# Patient Record
Sex: Female | Born: 1955 | Race: Black or African American | Hispanic: No
Health system: Midwestern US, Community
[De-identification: ages and names within clinical notes are randomized; demographics above are authoritative.]

## PROBLEM LIST (undated history)

## (undated) DIAGNOSIS — N2889 Other specified disorders of kidney and ureter: Secondary | ICD-10-CM

## (undated) DIAGNOSIS — Z01818 Encounter for other preprocedural examination: Secondary | ICD-10-CM

## (undated) DIAGNOSIS — G43909 Migraine, unspecified, not intractable, without status migrainosus: Secondary | ICD-10-CM

## (undated) DIAGNOSIS — I10 Essential (primary) hypertension: Secondary | ICD-10-CM

## (undated) DIAGNOSIS — E7800 Pure hypercholesterolemia, unspecified: Secondary | ICD-10-CM

## (undated) HISTORY — DX: Essential (primary) hypertension: I10

---

## 1993-12-26 HISTORY — PX: UTERINE FIBROID SURGERY: SHX826

## 2004-12-26 NOTE — Procedures (Signed)
[facility]                          STRESS ECHOCARDIOGRAM REPORT   NAME:   [name], [name]                           SS#:   [id number]   DOB:     November 23, 1956                                     AGE:        48   SEX:     F                                           ROOM#:     [id number]   MR#:    [id number]                                       DATE:   12/26/2004   REFERRING PHYSAngelina Ok                              TAPE/INDEX:   299/5837   PRETEST DATA:   INDICATION:  Chest pain   MEDS TAKEN:  --   MEDS HELD:  Metoprolol   TARGET HEART RATE:  173     85%:  147   RISK FACTORS:  Hypertension   BASELINE ECG:  Normal sinus rhythm; within normal limits   EXERCISE SUPERVISED BY:  --   TEST RESULTS:             BRUCE PROTOCOL    STAGE    SPEED (MPH)   GRADE (%)    TIME (MIN:SEC)     HR       BP   Resting                                                  89     120/70      1          1.7           10            3:00         126     184/88      2          2.5           12            3:00         139     200/88      3          3.4           14            0:41         153     ---/--   Recovery  Immediate        --     ---/--                                             2:00         109     165/64                                             4:00          93     140/62                                             6:00          91     130/52   REASON FOR STOPPING:  Fatigue; achieved target heart rate         TOTAL   EXERCISE TIME:  6:41   ACHIEVED HEART RATE:  153 (88%  max HR)                           EST.   METS:  --   HR RESPONSE:  Tachycardic                                         PEAK RPP:   30,600   BP RESPONSE:  Hypertensive                                        CHEST   PAIN:  None   OBSERVED DYSRHYTHMIAS:  None   ST SEGMENT CHANGES:  J point depression with insignificant rapidly   upsloping ST segments                                 WALL MOTION  ANALYSIS   LV WALL SEGMENT             PRE-EXERCISE             POST-EXERCISE   Basal Anteroseptal             Normal                 Hyperkinesis   Basal Septal                   Normal                 Hyperkinesis   Basal Inferoseptal             Normal                 Hyperkinesis   Basal Posterior                Normal                 Hyperkinesis  Basal Lateral                  Normal                 Hyperkinesis   Basal Anterior                 Normal                 Hyperkinesis   Mid Anteroseptal               Normal                 Hyperkinesis   Mid Septal                     Normal                 Hyperkinesis   Mid Inferoseptal               Normal                 Hyperkinesis   Mid Posterior                  Normal                 Hyperkinesis   Mid Lateral                    Normal                 Hyperkinesis   Mid Anterior                   Normal                 Hyperkinesis   Apical Septal                  Normal                 Hyperkinesis   Apical Inferior                Normal                 Hyperkinesis   Apical Lateral                 Normal                 Hyperkinesis   Apical Anterior                Normal                 Hyperkinesis   LV  Chamber Size                                        Smaller   ECG INTERPRETATION:  Negative by ECG criteria.   ECHO INTERPRETATION:  Normal.   OVERALL IMPRESSION:  Negative for ischemia.   ECG AND ECHO INTERPRETATION BY :                                          Harland German., M.D.   Ayesha Rumpf  D: 12/26/2004  T: 12/26/2004  2:56 P    wks

## 2004-12-26 NOTE — Procedures (Signed)
[facility]                          STRESS ECHOCARDIOGRAM REPORT   NAME:   [name], [name]                           SS#:   [id number]   DOB:     03/23/1956                                     AGE:        48   SEX:     F                                           ROOM#:     [id number]   MR#:    [id number]                                       DATE:   12/26/2004   REFERRING PHYS:   R. Manolio                              TAPE/INDEX:   299/5837   PRETEST DATA:   INDICATION:  Chest pain   MEDS TAKEN:  --   MEDS HELD:  Metoprolol   TARGET HEART RATE:  173     85%:  147   RISK FACTORS:  Hypertension   BASELINE ECG:  Normal sinus rhythm; within normal limits   EXERCISE SUPERVISED BY:  --   TEST RESULTS:             BRUCE PROTOCOL    STAGE    SPEED (MPH)   GRADE (%)    TIME (MIN:SEC)     HR       BP   Resting                                                  89     120/70      1          1.7           10            3:00         126     184/88      2          2.5           12            3:00         139     200/88      3          3.4           14            0:41         153     ---/--   Recovery                                 Immediate        --     ---/--                                             2:00         109     165/64                                             4:00          93     140/62                                             6:00          91     130/52   REASON FOR STOPPING:  Fatigue; achieved target heart rate         TOTAL   EXERCISE TIME:  6:41   ACHIEVED HEART RATE:  153 (88%  max HR)                           EST.   METS:  --   HR RESPONSE:  Tachycardic                                         PEAK RPP:   30,600   BP RESPONSE:  Hypertensive                                        CHEST   PAIN:  None   OBSERVED DYSRHYTHMIAS:  None   ST SEGMENT CHANGES:  J point depression with insignificant rapidly   upsloping ST segments                                  WALL MOTION ANALYSIS   LV WALL SEGMENT             PRE-EXERCISE             POST-EXERCISE   Basal Anteroseptal             Normal                 Hyperkinesis   Basal Septal                   Normal                 Hyperkinesis   Basal Inferoseptal             Normal                 Hyperkinesis   Basal Posterior                Normal                 Hyperkinesis     Basal Lateral                  Normal                 Hyperkinesis   Basal Anterior                 Normal                 Hyperkinesis   Mid Anteroseptal               Normal                 Hyperkinesis   Mid Septal                     Normal                 Hyperkinesis   Mid Inferoseptal               Normal                 Hyperkinesis   Mid Posterior                  Normal                 Hyperkinesis   Mid Lateral                    Normal                 Hyperkinesis   Mid Anterior                   Normal                 Hyperkinesis   Apical Septal                  Normal                 Hyperkinesis   Apical Inferior                Normal                 Hyperkinesis   Apical Lateral                 Normal                 Hyperkinesis   Apical Anterior                Normal                 Hyperkinesis   LV  Chamber Size                                        Smaller   ECG INTERPRETATION:  Negative by ECG criteria.   ECHO INTERPRETATION:  Normal.   OVERALL IMPRESSION:  Negative for ischemia.   ECG AND ECHO INTERPRETATION BY :                                          CHARLES C. ASHBY, JR., M.D.   bes  D: 12/26/2004  T: 12/26/2004  2:56 P    wks

## 2004-12-26 NOTE — Procedures (Signed)
[facility]                          STRESS ECHOCARDIOGRAM REPORT   NAME:   [name], [name]                           SS#:   [id number]   DOB:     [birth year]                                     AGE:        48   SEX:     F                                           ROOM#:     [id number]   MR#:    [id number]                                       DATE:   12/26/2004   REFERRING PHYSAngelina Ok                              TAPE/INDEX:   299/5837   PRETEST DATA:   INDICATION:  Chest pain   MEDS TAKEN:  --   MEDS HELD:  Metoprolol   TARGET HEART RATE:  173     85%:  147   RISK FACTORS:  Hypertension   BASELINE ECG:  Normal sinus rhythm; within normal limits   EXERCISE SUPERVISED BY:  --   TEST RESULTS:             BRUCE PROTOCOL    STAGE    SPEED (MPH)   GRADE (%)    TIME (MIN:SEC)     HR       BP   Resting                                                  89     120/70      1          1.7           10            3:00         126     184/88      2          2.5           12            3:00         139     200/88      3          3.4           14            0:41         153     ---/--   Recovery  Immediate        --     ---/--                                             2:00         109     165/64                                             4:00          93     140/62                                             6:00          91     130/52   REASON FOR STOPPING:  Fatigue; achieved target heart rate         TOTAL   EXERCISE TIME:  6:41   ACHIEVED HEART RATE:  153 (88%  max HR)                           EST.   METS:  --   HR RESPONSE:  Tachycardic                                         PEAK RPP:   30,600   BP RESPONSE:  Hypertensive                                        CHEST   PAIN:  None   OBSERVED DYSRHYTHMIAS:  None   ST SEGMENT CHANGES:  J point depression with insignificant rapidly   upsloping ST segments                                  WALL MOTION ANALYSIS   LV WALL SEGMENT             PRE-EXERCISE             POST-EXERCISE   Basal Anteroseptal             Normal                 Hyperkinesis   Basal Septal                   Normal                 Hyperkinesis   Basal Inferoseptal             Normal                 Hyperkinesis   Basal Posterior                Normal                 Hyperkinesis  Basal Lateral                  Normal                 Hyperkinesis   Basal Anterior                 Normal                 Hyperkinesis   Mid Anteroseptal               Normal                 Hyperkinesis   Mid Septal                     Normal                 Hyperkinesis   Mid Inferoseptal               Normal                 Hyperkinesis   Mid Posterior                  Normal                 Hyperkinesis   Mid Lateral                    Normal                 Hyperkinesis   Mid Anterior                   Normal                 Hyperkinesis   Apical Septal                  Normal                 Hyperkinesis   Apical Inferior                Normal                 Hyperkinesis   Apical Lateral                 Normal                 Hyperkinesis   Apical Anterior                Normal                 Hyperkinesis   LV  Chamber Size                                        Smaller   ECG INTERPRETATION:  Negative by ECG criteria.   ECHO INTERPRETATION:  Normal.   OVERALL IMPRESSION:  Negative for ischemia.   ECG AND ECHO INTERPRETATION BY :                                          Harland German., M.D.   Ayesha Rumpf  D: 12/26/2004  T: 12/26/2004  2:56 P    wks

## 2004-12-26 NOTE — Discharge Summary (Signed)
[facility]                       OUTPATIENT CENTER DISCHARGE SUMMARY   NAME: [name], [name]   MR#:          BILLING #: [id number]      DOS: 12/26/2004   DOD:12/26/2004 TIM   [id number]   cc:   Primary Physician:   Date/Time of Admission:  12/26/04 at 0400   Date/Time of Discharge:  12/26/04 at 1215   CHIEF COMPLAINT:   Chest pain.   HISTORY OF PRESENT ILLNESS:  [age]-year-old female seen in the   emergency department earlier this morning with chest pain.  Her initial   emergency department evaluation was unremarkable and she was subsequently   assigned to the observation department under chest pain protocol.   COURSE IN THE OBSERVATION DEPARTMENT:  She continued to have some   discomfort.  Her enzymes were negative.  She underwent exercise stress test   this morning, the results of which were interpreted as no ischemia.   PHYSICAL EXAMINATION:   GENERAL:   Very pleasant female.   VITAL SIGNS:  Stable.   HEENT:  Mouth/Throat:  Surfaces of the pharynx, palate, and tongue are   pink, moist, and without lesions.   NECK:  Supple, nontender, symmetrical, no masses or JVD, trachea midline,   thyroid not enlarged, nodular, or tender.   LYMPHATICS:  No cervical or submandibular lymphadenopathy palpated.   RESPIRATORY:  Clear and equal breath sounds.  No respiratory distress,   tachypnea, or accessory muscle use.   CARDIOVASCULAR:  Heart regular, without murmurs, gallops, rubs, or thrills.   PMI not displaced.   Vascular:   Calves soft and nontender.  No peripheral edema or significant   varicosities. Carotid, femoral, and pedal pulses are satisfactory.  The   abdominal aorta is not palpably enlarged.   CHEST:  Chest symmetrical without masses or tenderness.   ABDOMEN:  Soft and nontender.   The probabilistic nature of cardiac diagnostic testing was explained.  The   patient was counseled to seek further cardiac evaluation should symptoms    worsen or persist without another diagnosis being found.   FINAL DIAGNOSIS:   Acute precordial chest pain, negative stress test.   DISPOSITION:  The patient is discharged with verbal and written   instructions and a referral for ongoing care.  The patient is aware that   they may return at any time for new or worsening symptoms.   The patient   was given a prescription for Vicodin #16.  She is aware she should return   to the emergency department if increasing pain, trouble breathing, or any   concerns.   Electronically Signed By:   Erlinda Hong, M.D. 01/10/2005 06:40   ____________________________   TODD Wilfrid Lund, M.D.   zga  D:  12/26/2004  T:  12/27/2004 10:17 A   [id number]   Blanchard Mane, PA

## 2004-12-26 NOTE — ED Provider Notes (Signed)
[facility]                      EMERGENCY DEPARTMENT TREATMENT REPORT   OBSERVATION UNIT   NAME:  [name], [name] [facility]              PT. LOCATION:     ERO EO15   MR #:         BILLING #: [id number]          DOS: 12/26/2004   TIME: 3:45 A   [id number]   cc:   Primary Physician:   The patient was seen at 2:00 in the morning.   CHIEF COMPLAINT:   Chest pain.   HISTORY OF PRESENT ILLNESS:  This is a 49 year old black female complaining   of chest pain that started earlier tonight after the patient was holding   her grandchild.  She had no shortness of breath, no PND, no orthopnea, and   no edema of the lower extremities.  Does have pain radiating into her left   arm.  Did not get any better or worse.  Now that she took the   nitroglycerin, it has gotten better.  No fever, no chills, no nausea, no   vomiting.   REVIEW OF SYSTEMS:   CONSTITUTIONAL:  No fever, chills, weight loss.   EYES: No visual symptoms.   ENT: No sore throat, runny nose or other URI symptoms.   ENDOCRINE:  No diabetic symptoms.   HEMATOLOGIC/LYMPHATIC:  No excessive bruising or lymph node swelling.   ALLERGIC/IMMUNOLOGIC:  No urticaria or allergy symptoms.   RESPIRATORY:  No cough, shortness of breath, or wheezing.   CARDIOVASCULAR:  Complaining of chest pain.   GASTROINTESTINAL:  No vomiting, diarrhea, or abdominal pain.   GENITOURINARY:  No dysuria, frequency, or urgency.   MUSCULOSKELETAL:  No joint pain or swelling.   INTEGUMENTARY:  No rashes.   NEUROLOGICAL:  No headaches, sensory or motor symptoms.   PAST MEDICAL HISTORY:  Hypertension and obesity.   FAMILY AND SOCIAL HISTORY:  Other than a family history of coronary artery   disease and MI.   ALLERGIES:   No known drug allergies.   MEDICATIONS:   The patient does not recall.   PHYSICAL EXAMINATION:   VITAL SIGNS:  Blood pressure 150/102, pulse 78, respirations 20,   temperature 98.2, O2 saturations of 98%.  Recheck blood pressure 135/61.    CONSTITUTIONAL:  The patient appears well developed and well nourished.   Appearance and behavior are age and situation appropriate.   HEENT:   Head:  Normocephalic, atraumatic.  Eyes:  Conjunctivae clear, lids   normal.  Pupils equal, symmetrical, and normally reactive.   NECK:  Supple, nontender, symmetrical, no masses or JVD, trachea midline,   thyroid not enlarged, nodular, or tender.   RESPIRATORY:  Clear and equal breath sounds.  No respiratory distress,   tachypnea, or accessory muscle use.   CARDIOVASCULAR:  Heart regular rate and rhythm without any rubs, murmurs,   gallops, or thrills.   GI:  Abdomen soft, nontender, without complaint of pain to palpation.  No   hepatomegaly or splenomegaly.   SKIN:  Warm and dry without rashes.   NEUROLOGIC:  Cranial nerves, deep tendon reflexes, strength, and light   touch sensation are unremarkable.   COURSE IN THE EMERGENCY DEPARTMENT:  CBC, BMP, cardiac profile, chest x-ray   PA and lateral, EKG.  The patient was given 1 inch of paste, aspirin 81 mg  x 4 p.o.  On the monitor, normal sinus rhythm.   DIAGNOSTIC TESTING:  EKG reveals normal sinus rhythm with nonspecific ST-T   wave changes.  X-ray shows no acute disease.  Cardiac profile was within   normal limits.  CPK was 216, troponin was 0, MB was 1.2, relative index was   0.6, serum myoglobin was 32.7.  CBC:  White count was 7.9, hemoglobin 13.7,   hematocrit 38.5, platelets are 419. CMP was 137, potassium 3.8, chloride   99, CO2 of 30, glucose 119, BUN 12, creatinine 0.9, SGOT 33, SGPT 33,   alkaline phosphatase 86, bilirubin total is 0.2, calcium 9.2, total protein   7.5, albumin is 3.9.   COURSE IN THE EMERGENCY DEPARTMENT:  The patient will be admitted to the   observation unit at 3:30 in the morning for chest pain.   DISPOSITION/PLAN:  The patient is discharged with verbal and written   instructions and a referral for ongoing care.  The patient is aware that   they may return at any time for new or worsening    symptoms.  Condition is stable.  Disposition is the observation unit.   Serial enzymes will be done prior to the stress test as well.   Electronically Signed By:   Imogene Burn, M.D. 12/27/2004 18:24   ____________________________   Imogene Burn, M.D.   gm  D:  12/26/2004  T:  12/26/2004  7:16 A   [id number]   Hilaria Ota, PA-C

## 2005-10-18 NOTE — Discharge Summary (Signed)
[facility]                       OUTPATIENT CENTER DISCHARGE SUMMARY   FELICIANA, NARAYAN   MR#:          BILLING #: [id number]      DOS: 10/18/2005   DOD:10/19/2005 TIM   05-38-54   cc:   Primary Physician:   Date and time of admission:  10-19-05 at 0200 hours   Date and time of discharge:  10-19-05 at 1130 hours   ADMISSION DIAGNOSES:      1. Acute precordial chest pain, gastroesophageal reflux disease and      distress.      2. History of hypertension, family history of coronary disease.   DISCHARGE DIAGNOSES:      1. Acute precordial chest pain, cardiac unlikely-probable      gastrointestinal versus stress.      2. History of hypertension and family history of coronary disease.   HISTORY:  The patient is a 49 year old female who presented to the   emergency department yesterday for evaluation of chest discomfort radiating   to her left arm.  Initial emergency department evaluation was negative for   acute coronary artery disease.  The patient consequently sent to the   observation unit for second set of enzymes as the patient has had recent   cardiac catheterization in July down at [facility] which showed   just a minor 20% area in the left anterior descending, has an ejection   fraction of 40% and otherwise unremarkable.  D-dimer was normal in the   emergency department.  The patient did have some relief with morphine and   Benadryl.   COURSE IN THE OBSERVATION UNIT:   The patient had some continued   discomfort, relieved with morphine and Benadryl.  Her vital signs were   stable.  The patient's second troponin was 0.0 and her myoglobin went down   to 23.6 from 24.2.   DISPOSITION/PLAN:  Due to the patient's negative second troponin and   myoglobin and recent fairly negative and unremarkable cardiac   catheterization, Dr. Frances Furbish felt that she is fine for discharge and   follow up with her doctors within the week.  To try Prilosec    over-the-counter and will also write her a prescription for Vicodin.  She   is told to return if any worsening.   The probabilistic nature of cardiac diagnostic testing was explained.  The   patient was counseled to seek further cardiac evaluation should symptoms   worsen or persist without another diagnosis being found.  This patient was   examined and discussed with Dr. Frances Furbish and he agrees.   Electronically Signed By:   Stormy Card, M.D. 10/19/2005 14:14   ____________________________   Stormy Card, M.D.   jj  D:  10/19/2005  T:  10/19/2005  1:42 P   [id number]   Olean Ree, PA-C

## 2005-10-18 NOTE — ED Provider Notes (Signed)
OBSERVATION                           [facility]                      EMERGENCY DEPARTMENT TREATMENT REPORT   NAME:  [name]                    PT. LOCATION:     ERO EO05   MR #:         BILLING #: [id number]          DOS: 10/18/2005   TIME:12:17 A   05-38-54   cc:   Primary Physician:   CHIEF COMPLAINT:   Chest pain.   HISTORY OF PRESENT ILLNESS:  The patient is a 49 year old female who has a   24-hour history of substernal chest pain radiating to her left arm that   started while she was asleep.  It comes and goes, gets a little worse when   she is under stress.  She is undergoing a divorce at this time.  She cannot   get comfortable with it.   She went to [facility] and was sent here for   further evaluation.  Given nitroglycerin which she thinks may have helped a   little bit.  EMS transported her across the street.  Saturations were 98%.   Blood pressure 136/106 by EMS.  EMS did not give her any additional   nitroglycerin.  Aspirin already given.   PAST MEDICAL HISTORY:   Hypertension.  The patient was here and had   negative stress test January 1.  Also was admitted in July to [facility] and there underwent catheterization which she said "They saw just   a little thing in one of the arteries."   SOCIAL HISTORY:  Does not smoke.   FAMILY HISTORY:  She had significant history of coronary disease in the   family.   REVIEW OF SYSTEMS:  She has nausea.  No vomiting or diarrhea.  No upper   respiratory symptoms.  No trauma.  No swelling.  No DVT risks.  Denies   complaints in any other system.   ALLERGIES:  None.   MEDICATIONS:   As given above.  At home, she is on metoprolol.   PHYSICAL EXAMINATION:   GENERAL:   Well-developed, well-nourished female who is pleasant, alert,   rubbing her chest.  She is not toxic, not dehydrated.  Slightly anxious,   but otherwise very cooperative.   VITAL SIGNS:  Blood pressure 155/73, pulse 63, respirations 18, temperature    95.  O2 saturation is 99%.   NECK:  Supple.  No JVD or masses.   LUNGS:  Clear to auscultation.   CHEST WALL:  Nontender.  No zoster.   HEART:  Regular rate and rhythm.  No murmurs or gallops.   ABDOMEN:  Nontender, soft, and active bowel sounds.   EXTREMITIES:  No cyanosis or edema.   PULSES:  2 plus, equal.   NEURO:  Alert and oriented, moves all extremities spontaneously, no facial   asymmetry and ambulates without difficulty.   PSYCHIATRIC:  Judgment appears appropriate.  Recent and remote memory   appear to be intact.   Oriented to time, place and person.  Mood and affect   appropriate.   IMPRESSION/MANAGEMENT PLAN:  The patient presents here with chest pain   under a  lot of stress right now.  Will obtain catheterization report.  She   does not want anything for pain like morphine.  GI cocktail will be given   and the patient will be evaluated and treated symptomatically.  Nursing   notes were reviewed.  As an acute illness posing a potential threat to life   or bodily function, this is a high-risk presentation necessitating an   immediate diagnostic evaluation.   CONTINUATION BY DR. KISA:   LABORATORY TESTS:   BMP within normal limits.  Cardiac enzymes negative for   ischemia or infarction.  Hematocrit 36 and hemoglobin 12.  D-dimer normal   at 0.45.  Chest x-ray portable was of poor quality, but a chest x-ray PA   and lateral showed no acute abnormalities per Dr. Arvella Merles.  EKG showed normal   sinus rhythm, no axis deviation, no acute abnormality, similar to the one   done at [facility].   COURSE IN THE EMERGENCY DEPARTMENT:  The patient given Mylanta, viscous   Xylocaine, and Pepcid with minimal relief, but morphine and Benadryl made   her symptoms go away.  I obtained her records from the [facility]   admission in July.  She had a cardiac catheterization then, which showed a   minor 20% area in the LAD, has an EF of 40%.  She had been worked up for    chest pain there.  The patient was placed in the observation unit, and we   will repeat her enzymes.  If she does well on her enzymes, she will be able   to be sent home.  She was questioning help with her stress associated with   her divorce.   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   CLINICAL IMPRESSION:      1. Acute precordial chest pain, gastroesophageal reflux disease and      distress.      2. History of hypertension, family history of coronary disease.   Electronically Signed By:   Wetzel Bjornstad Arvella Merles, M.D. 10/20/2005 01:43   ____________________________   Wetzel Bjornstad. Arvella Merles, M.D.   zga/gm   D:  10/19/2005  T:  10/19/2005  6:25 A   [id number]

## 2005-10-18 NOTE — Procedures (Signed)
.*  Normal sinus rhythm   *Nonspecific T change   .*No significant change since prior tracing   *Improvement in ST-T change since prior ECG

## 2005-10-19 NOTE — Procedures (Signed)
.*  Normal sinus rhythm   *Nonspecific T change   .*No significant change since prior tracing   *Improvement in ST-T change since prior ECG

## 2005-12-13 NOTE — Op Note (Signed)
[facility]                                OPERATION REPORT                       SURGEON:  Charlean Merl, M.D.   [facility] [name], [name]   E:   MR  [id number]                         DATE:            12/13/2005   #:   [name]  [id number]                      PT. LOCATION:   #   Charlean Merl, M.D.   cc:    Charlean Merl, M.D.   PREOPERATIVE DIAGNOSIS:   Pelvic pain, pelvic mass.   POSTOPERATIVE DIAGNOSIS:   Adnexal cyst, adhesions, myoma and endometriosis.   PROCEDURE:   Tubal cystectomy, enterolysis, treatment of uterine leiomyomata,   fulguration of endometriosis, aspiration of ovarian cyst, biopsy of pelvic   inflammatory growth at cecum.   SURGEON:   Dr. Joaquim Lai   ASSISTANT:   Surgical scrub tech   ANESTHESIA:   [facility], general endotracheal.   INDICATIONS:  The patient had the presence of pelvic pain and a pelvic mass   with history of fibroids and endometriosis.   FINDINGS:  There were adhesions and adnexal cyst and ovarian cyst   consistent with the preoperative findings.  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  There was endometriosis present on the pelvic sidewall.   There was an inflammatory growth on the pelvis at the cecum.   DESCRIPTION OF PROCEDURE:  The patient was placed in the dorsal lithotomy   position, draped and prepped in the routine sterile manner.   Pneumoperitoneum and trocar placement were performed without complications.   The above findings were noted.  Attention was turned to the adhesions that   were present along with the adnexal cyst.  Lysis was performed, freeing up   the adnexa until the cyst could be visualized.  This appeared to be arising   from the tube, had benign characteristics.  A tubal cystectomy was   performed, sending it for pathology.  At the completion, hemostasis was   complete.  Attention was turned to adhesions on the descending colon and   cecum.  Enterolysis was performed, dissecting within an avascular  plane,   staying away from the bowel wall.  At the completion, hemostasis was   complete and there was no injury to the bowel.  Attention was turned to the   serosal leiomyomata.  Scissor dissection was used to free up the attachment   to the uterus.  Cautery was used to complete hemostasis in the process of   dissection.  Cautery then vaporized the myomas, which were suction   irrigated and removed.  At the completion, hemostasis was complete and   there was no residual myoma.  Attention was turned to the endometriosis.   The area was isolated, fulgurated, treating the entire area and staying   away from surrounding structure.  At the completion, there was no visible   residual endometriosis.   Attention was turned to the ovarian cyst.  This was a simple ovarian cyst   without evidence  of malignant change.  This was opened, aspirated and   drained.  The base of it was cauterized.  Attention was turned to the   inflammatory growth in the pelvis at the cecum.  This appeared to be a   benign neoplastic change.  This was biopsied and removed in its entirety.   At the completion, hemostasis was complete and there was no evidence of   further change.  The procedure was completed.  The gas was allowed to flow   out of the peritoneal cavity.  The subumbilical fascia was closed with   Vicryl suture.  The skin was closed in a subcuticular fashion.  Ten cc of    % Marcaine was used for local infiltrate.  The final sponge and needle   count was correct.  The patient was taken to the recovery room in good   condition.   IV's:                     500 cc Ringer's lactate.   PACKS &amp; DRAINS:           None.   ESTIMATED BLOOD LOSS:    Minimal.   Electronically Signed By:   Charlean Merl, M.D. 12/16/2005 13:16   _________________________________   Charlean Merl, M.D.   md  D:  12/13/2005  T:  12/13/2005  1:26 P   [id number]

## 2005-12-13 NOTE — Op Note (Signed)
[facility]                                OPERATION REPORT                       SURGEON:  Charlean Merl, M.D.   [facility] [name], [place]rina [place]   E:   MR  [id number]                         DATE:            12/13/2005   #:   Lindley Magnus  [id number]                      PT. LOCATION:   #   Charlean Merl, M.D.   cc:    Charlean Merl, M.D.   PREOPERATIVE DIAGNOSIS:   Pelvic pain, pelvic mass.   POSTOPERATIVE DIAGNOSIS:   Adnexal cyst, adhesions, myoma and endometriosis.   PROCEDURE:   Tubal cystectomy, enterolysis, treatment of uterine leiomyomata,   fulguration of endometriosis, aspiration of ovarian cyst, biopsy of pelvic   inflammatory growth at cecum.   SURGEON:   Dr. Joaquim Lai   ASSISTANT:   Surgical scrub tech   ANESTHESIA:   [facility], general endotracheal.   INDICATIONS:  The patient had the presence of pelvic pain and a pelvic mass   with history of fibroids and endometriosis.   FINDINGS:  There were adhesions and adnexal cyst and ovarian cyst   consistent with the preoperative findings.  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  There was endometriosis present on the pelvic sidewall.   There was an inflammatory growth on the pelvis at the cecum.   DESCRIPTION OF PROCEDURE:  The patient was placed in the dorsal lithotomy   position, draped and prepped in the routine sterile manner.   Pneumoperitoneum and trocar placement were performed without complications.   The above findings were noted.  Attention was turned to the adhesions that   were present along with the adnexal cyst.  Lysis was performed, freeing up   the adnexa until the cyst could be visualized.  This appeared to be arising   from the tube, had benign characteristics.  A tubal cystectomy was   performed, sending it for pathology.  At the completion, hemostasis was   complete.  Attention was turned to adhesions on the descending colon and    cecum.  Enterolysis was performed, dissecting within an avascular plane,   staying away from the bowel wall.  At the completion, hemostasis was   complete and there was no injury to the bowel.  Attention was turned to the   serosal leiomyomata.  Scissor dissection was used to free up the attachment   to the uterus.  Cautery was used to complete hemostasis in the process of   dissection.  Cautery then vaporized the myomas, which were suction   irrigated and removed.  At the completion, hemostasis was complete and   there was no residual myoma.  Attention was turned to the endometriosis.   The area was isolated, fulgurated, treating the entire area and staying   away from surrounding structure.  At the completion, there was no visible   residual endometriosis.   Attention was turned to the ovarian cyst.  This was a simple ovarian cyst   without evidence  of malignant change.  This was opened, aspirated and   drained.  The base of it was cauterized.  Attention was turned to the   inflammatory growth in the pelvis at the cecum.  This appeared to be a   benign neoplastic change.  This was biopsied and removed in its entirety.   At the completion, hemostasis was complete and there was no evidence of   further change.  The procedure was completed.  The gas was allowed to flow   out of the peritoneal cavity.  The subumbilical fascia was closed with   Vicryl suture.  The skin was closed in a subcuticular fashion.  Ten cc of    % Marcaine was used for local infiltrate.  The final sponge and needle   count was correct.  The patient was taken to the recovery room in good   condition.   IV's:                     500 cc Ringer's lactate.   PACKS &amp; DRAINS:           None.   ESTIMATED BLOOD LOSS:    Minimal.   Electronically Signed By:   Charlean Merl, M.D. 12/16/2005 13:16   _________________________________   Charlean Merl, M.D.   md  D:  12/13/2005  T:  12/13/2005  1:26 P   [id number]

## 2008-09-24 NOTE — Procedures (Signed)
Test Reason : Other/CP   Blood Pressure : ***/*** mmHG   Vent. Rate : 068 BPM     Atrial Rate : 068 BPM   P-R Int : 160 ms          QRS Dur : 088 ms   QT Int : 390 ms       P-R-T Axes : 044 040 074 degrees   QTc Int : 414 ms   Normal sinus rhythm   Possible Left atrial enlargement   T wave abnormality, consider anterior ischemia   Abnormal ECG   When compared with ECG of 19-Oct-2005 08:49, No significant change was found   Confirmed by Merilynn Finland, M.D., Scott (26) on 09/25/2008 9:31:59 AM   Referred By: Vinnie Langton, MD           Overread By: Marcello Moores, M.D.

## 2008-09-24 NOTE — Procedures (Signed)
Test Reason : Other/CP   Blood Pressure : ***/*** mmHG   Vent. Rate : 068 BPM     Atrial Rate : 068 BPM   P-R Int : 160 ms          QRS Dur : 088 ms   QT Int : 390 ms       P-R-T Axes : 044 040 074 degrees   QTc Int : 414 ms   Normal sinus rhythm   Possible Left atrial enlargement   T wave abnormality, consider anterior ischemia   Abnormal ECG   When compared with ECG of 19-Oct-2005 08:49, No significant change was found   Confirmed by Robertson, M.D., Scott (26) on 09/25/2008 9:31:59 AM   Referred By: Erik Kisa, MD           Overread By: Scott Robertson, M.D.

## 2008-09-24 NOTE — ED Provider Notes (Signed)
[name]                      EMERGENCY DEPARTMENT TREATMENT REPORT   NAME:  [name]        PT. LOCATION:    [id number]       DOB:  [birth year]                                                                     AGE:  52   MR #:       BILLING #:           DOS: 09/24/2008  TIME: 7:06 P   SEX:  F   [birth year]    [id number]   cc:    Ulla Gallo, M.D.   Primary Physician:   Primary Physician:  Ulla Gallo, M.D.   TIME SEEN 17:20.   CHIEF COMPLAINT   Chest pain.   HISTORY OF PRESENT ILLNESS   A 52 year old female who is coming in with chest pain, said she was sent by   her doctor.  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  When she got done with her lecture, she called her   doctor who sent her here.  This bout of pain has been going on now since   about 3:30, so almost 2 hours.  She describes it as a pain just off to the   right of her chest.  She says she has also been noticing weakness in her   right arm.  She tells me that has been coming and going the last few days.   She has had about four to five bouts of chest pain also over the last   couple of days.  She says she gets sweaty with the pain, but no nausea or   shortness of breath.  She also tells me about an episode a few weeks ago   where she said she felt like her whole right side went numb, and it lasted   about a half an hour.   REVIEW OF SYSTEMS   CONSTITUTIONAL:  No fever, chills, weight loss.   EYES: No visual symptoms.   ENT: No sore throat, runny nose or other URI symptoms.   RESPIRATORY:  No cough, shortness of breath, or wheezing.   CARDIOVASCULAR:  Chest pain as above, no palpitations.   GASTROINTESTINAL:  No vomiting, diarrhea, or abdominal pain.   GENITOURINARY:  No dysuria, frequency, or urgency.   MUSCULOSKELETAL:  No joint pain or swelling.   INTEGUMENTARY:  No rashes.   NEUROLOGICAL: As above.   PAST MEDICAL HISTORY   Hypertension.   SURGICAL HISTORY    Noncontributory.   SOCIAL HISTORY   Denies tobacco.   FAMILY HISTORY Positive  for coronary disease and strokes.   MEDICATIONS Metoprolol, lisinopril and occasional aspirin.   ALLERGIES   None.   PHYSICAL EXAMINATION   Temperature 98.2, pulse 79, blood pressure 119/77, respirations 24, oxygen   97 on room air.   GENERAL:  Well-developed female in no acute distress.   HEAD:  Normocephalic.   EYES:  Anicteric.  Conjunctivae clear.  Pupils round and reactive to  light.   LUNGS:  Clear and equal.  No wheezing noted.   HEART:  Regular, no murmur appreciated.   ABDOMEN:  Nondistended, positive bowel sounds, soft, nontender.   MUSCULOSKELETAL:  Extremities warm, calves soft.  No edema.   SKIN:  Warm and dry without rashes.   NEUROLOGICAL:  She is awake, alert, can answer questions appropriately.   Her right upper extremity strength is a 4/5 compared to the 5/5 on the   left.  Right lower extremity strength also 4/5 compared to 5/5 on the left.   No facial asymmetry noted.  Tongue protrudes midline.   INITIAL ASSESSMENT   This is a 52 year old female who is coming in also with a complaint of   chest pain today, but has some other issues of some weakness and numbness   on her right side which makes me concerned about transient ischemic   attacks.  She also tells me then about episodes for the last 2 days where   she feels like she has been somewhat forgetful about things.  For example,   she said she was trying to get back to her office at work and she felt like   she just kind of forgot where she was going, and then she was giving a   speech for work and kind of just felt like she forgot what she needed to   say.   CONTINUATION BY: ERIK H. Arvella Merles, M.D.   INITIAL ASSESSMENT AND MANAGEMENT PLAN   The patient presents here with a 2-day history of having chest pain which   is worrisome.  She has risk factors of family history of MI in father and 2   of  her siblings, also has hypertension.  Symptoms have been coming and    going  for 4 days, that  with diaphoresis.  Chest pain seems random, event   at rest.  Also concerning  is she  feels weak on the right arm and leg.   Said she feels a little weak with walking, cannot stand for a long time, as   her right leg gets weaker, and she cannot hold things that are heavy with   the right arm because she drops them.  She presented here for further   evaluation of both problems.  This is a new problem for this patient.  Old   records reviewed.   LABORATORY TESTS   EKG showed nonspecific ST-T wave changes.  The CT scan was negative per   radiologist.  Chest x-ray negative per radiologist  for acute changes.   Cardiac enzymes negative for ischemia or infarction at this time.  Normal   BMP, INR and CBC noted.   COURSE IN THE EMERGENCY DEPARTMENT:   The patient was medicated with nitro   paste and aspirin.  Chest pain went away.  Neurological status remained   unchanged.  She had a headache for which Tylenol was given secondary to the   nitroglycerin.  She had repeat exams.  Monitor remained sinus rhythm.  No   acute changes were noted.  We discussed this with Dr. Nedra Hai, because the   patient has had a very mild neurological deficit on the right, and this   will be worked up as an inpatient along with her worrisome chest pain.   This will be evaluated for angina.  Placed on telemetry.   CLINICAL IMPRESSION   1. Acute precordial chest pain.   2. Extensive cardiac risk factors, as noted above.  3. Right side arm and leg weakness suggestive of stroke.   Electronically Signed By:   Wetzel Bjornstad Arvella Merles, M.D. 09/26/2008 23:24   ____________________________   Wetzel Bjornstad. Arvella Merles, M.D.   Dictated by:  Nelma Rothman, PA-C   My signature above authenticates this document and my orders, the final   diagnosis(es), discharge prescription(s) and instructions in the [facility] record.   PB  D:  09/24/2008  T:  09/25/2008  3:41 A   [id number]/

## 2008-09-25 NOTE — Procedures (Signed)
Acquisition Time: 2008-09-25  10:58:20   Total Exercise Time: 67 secs   Test Indications: Chest pain   Medications:   Protocol: REGADENOSON   Max HR: 109 BPM  64% of  Pred: 170 BPM   Max BP: 174/087 mmHG   Max Work Load: 1.0 METS   Conclusion: Please Correlate with Nuclear Report:   Negative ECG Response to IV Regadenoson (Lexiscan)   Conclusion: Please Correlate with Nuclear Report:   Confirmed by Ashby, M.D., Charles Jr. (30) on 09/25/2008 1:21:32 PM   Referred By: Erik Kisa, MD           Overread By: Charles J   Ashby, M.D.

## 2008-09-25 NOTE — Procedures (Signed)
Acquisition Time: 2008-09-25  10:58:20   Total Exercise Time: 67 secs   Test Indications: Chest pain   Medications:   Protocol: REGADENOSON   Max HR: 109 BPM  64% of  Pred: 170 BPM   Max BP: 174/087 mmHG   Max Work Load: 1.0 METS   Conclusion: Please Correlate with Nuclear Report:   Negative ECG Response to IV Regadenoson Eugenie Birks)   Conclusion: Please Correlate with Nuclear Report:   Confirmed by Cleon Gustin, M.D., Madelynn Done. (30) on 09/25/2008 1:21:32 PM   Referred By: Vinnie Langton, MD           Overread By: Merlene Pulling, M.D.

## 2008-09-25 NOTE — Procedures (Signed)
[facility]                                 NUCLEAR REPORT   [name], [name]                           SS#:      [id number]   DOB:     Dec 07, 1956                                     AGE:      51   SEX:     F                                           ROOM#:   [id number]   MR#:    [id number]                                       DATE:    09/25/2008   BILLING#  [id number]   REFERRING PHYS:   [facility]                                 NUCLEAR REPORT   INDICATION   Chest pain   PROCEDURE AND FINDINGS   SPECT sestamibi scintigraphy was performed following Lexiscan-induced   hyperemia with 30 mCi of Tc42m-technetium sestamibi injected and this   following resting SPECT thallium scintigraphy with 4 mCi of  201 thallium   injected.  Stress images reveal normal uniform uptake of radiotracer   throughout the myocardium following Lexiscan-induced hyperemia and an   identical pattern on preceding rest SPECT thallium scintigraphy with no   evidence for redistribution.  R-wave gated cine loop resting left   ventricular sestamibi scintigraphy displays normal chamber size, systolic   function and calculated ejection fraction of 67%.   OVERALL IMPRESSION   Normal Lexiscan gated SPECT nuclear stress test using dual-isotope   technique with left ventricular ejection fraction of 67%.  No evidence for   scar or ischemia.   This study reviewed with Dr. Gates Rigg   Do not type on or below this line   Electronically Signed By:   Donnel Saxon, M.D. 09/26/2008 08:23   ______________________________   Ramon Dredge C. MILLER, M.D.   AK  D: 09/25/2008  T: 09/25/2008  5:55 P    [id number]   cc:   [facility]         EDWARD C. MILLER, M.D.

## 2008-09-25 NOTE — H&P (Signed)
[facility]                              HISTORY AND PHYSICAL                               Garen Grams, M.D.   NAMESHALICIA, [name]   MR #:    [id number]                  ADM DATE:          09/24/2008   BILLING  [id number]                 PT. LOCATION       [id number]   #:   SS #     [id number]               DOB:  [birth year]   AGE:  51   PATTERSON LEE, M.D.                SEX:  F   cc:    Ulla Gallo, M.D.          [facility]          PATTERSON LEE, M.D.   CHIEF COMPLAINT   Chest pain and right-sided weakness.   HISTORY OF THE PRESENT ILLNESS   This patient is a 52 year old black female with a history of hypertension.   She was in her usual state of health until about 3-4 days ago when she   began to have episodes of intermittent chest pain midsternally, described   as sharp to sometimes a heaviness sensation.  It lasted a few minutes and   relieved with rest.  No diaphoresis.  No palpitations.  No cough, fever or   chills.  Around the same time also she began to have episodes of subjective   right-sided weakness with decreased grip strength, but she was ambulatory.   She works as a Veterinary surgeon.  Earlier yesterday, she had the sensation of the   chest discomfort while she was speaking in front of a large group of   students.  She was able to complete her speech and contacted her primary   care doctor who advised her to come to the ER where she was advised   admission.  Presently, she  still has some subjective right-sided weakness.   She denies any chest pain, fever or chills.   PAST MEDICAL HISTORY   Significant for hypertension.  She denies any history of coronary artery   disease.  No history of diabetes.   PAST SURGICAL HISTORY   Includes surgery for a fibroid uterus.   ALLERGIES   The patient has no known drug allergies.   MEDICATIONS   The patient's current medications include metoprolol 50 mg b.i.d. and   lisinopril 20 mg once a day.    PERSONAL AND SOCIAL HISTORY   She is a nonsmoker.  No history of recreational drug or alcohol use.   FAMILY HISTORY   The patient's parents both died in their 57s.  The patient's father had   coronary artery disease.  The patient's mother had a stroke.   REVIEW OF SYSTEMS   CONSTITUTIONAL:   The patient denies any fever or chills.  No change in her   weight.  No change in her appetite.   HEAD:  No headache or dizziness.   EYES:  No eye pain.  No blurry vision.  No eye discharge.   NOSE:  No anosmia.  No epistaxis.  No nasal congestion.   EARS:  Denies ear pain or ear discharge.   MOUTH:  No oral ulcers.  No odynophagia or dysphagia.   NECK:  No neck pain or neck stiffness.  There is full range of motion.   CHEST:  No cough.  No shortness of breath.  No hemoptysis.   HEART:  Shows the above-mentioned chest pain.  No orthopnea.  No PND.  No   palpitations.   ABDOMEN:  No nausea, vomiting, diarrhea, melena or hematochezia.   GENITOURINARY:  No urinary frequency or urgency.  No dysuria.   ENDOCRINE:  No heat or cold intolerance.  No polyuria, polydipsia or   polyphagia.   NEUROLOGIC:  No loss of consciousness.  There is the above-mentioned   right-sided weakness.  No seizure episodes.   PHYSICAL EXAMINATION   GENERAL:  This is a well-developed overweight black female not in any acute   respiratory distress.  VITAL SIGNS:  Temperature is 98, blood pressure   156/81, pulse 82, respirations 18, O2 sats 97% on room air.   HEENT:  Normocephalic and atraumatic.  Conjunctivae pink.  Sclerae are   nonicteric.  Pupils equally round and reactive to light and accommodation.   Full extraocular movements.   NECK:  No JVD.  No bruits.  No lymphadenopathy.  No thyromegaly.  Trachea   is midline with no deviation.  CHEST AND LUNGS:  Symmetrical chest   expansion.  Clear breath sounds.  No rales, wheezes or rhonchi.   HEART:  Regular rate and rhythm.  Normal S1 and S2.  No murmurs, lifts,   heaves or rubs.    ABDOMEN:  Soft, nontender.  No guarding or rebound tenderness.  Bowel   sounds are normoactive.   EXTREMITIES:  No cyanosis.  Good pulses bilaterally   NEUROLOGIC:  The patient is awake, alert and oriented x 3.  Cranial nerves   grossly intact.  There is a decreased grip strength in the right extremity.   IMAGING STUDIES   The patient had a CT scan of the head which did not show any acute infarct   or hemorrhage.  Chest x-ray did not show any acute cardiopulmonary disease.   EKG showed low voltage but otherwise normal sinus rhythm, no acute ST wave   changes.  PT, PTT and INR levels are normal.  Cardiac enzymes with troponin   levels are negative x2.  The white count is 6.9, hemoglobin 12.9, platelets   307, sodium 139, potassium 3.4, chloride 98, bicarbonate 29, glucose 156,   BUN 13 and creatinine 1.1.   IMPRESSION AND PLAN   1. Chest pain, rule out ischemia.   2. Right-sided weakness, to consider a cerebrovascular accident or a      transient ischemic attack.   3. Hypertension.   Plan - The patient is admitted to telemetry.  Will get 3 sets of cardiac   enzymes.  She will be started on aspirin.  Will have physical therapy and   occupational therapy evaluate her.  Will schedule her for an MRI of the   brain, carotid Doppler studies and a 2-D echocardiogram.  Will also check a   lipid panel.  Her overall condition is guarded.   Electronically Signed By:   Garen Grams, M.D. 10/03/2008 04:51   ____________________________   Garen Grams,  M.D.   Marton Redwood  D:  09/25/2008  T:  09/25/2008  8:40 A   [id number]

## 2008-09-25 NOTE — Procedures (Signed)
[facility]                                 NUCLEAR REPORT   NAME:   [name], Rachel                           SS#:      [id number]   DOB:     05/22/1956                                     AGE:      51   SEX:     F                                           ROOM#:   [id number]   MR#:    [id number]                                       DATE:    09/25/2008   BILLING#  [id number]   REFERRING PHYS:   [facility]                                 NUCLEAR REPORT   INDICATION   Chest pain   PROCEDURE AND FINDINGS   SPECT sestamibi scintigraphy was performed following Lexiscan-induced   hyperemia with 30 mCi of Tc99m-technetium sestamibi injected and this   following resting SPECT thallium scintigraphy with 4 mCi of  201 thallium   injected.  Stress images reveal normal uniform uptake of radiotracer   throughout the myocardium following Lexiscan-induced hyperemia and an   identical pattern on preceding rest SPECT thallium scintigraphy with no   evidence for redistribution.  R-wave gated cine loop resting left   ventricular sestamibi scintigraphy displays normal chamber size, systolic   function and calculated ejection fraction of 67%.   OVERALL IMPRESSION   Normal Lexiscan gated SPECT nuclear stress test using dual-isotope   technique with left ventricular ejection fraction of 67%.  No evidence for   scar or ischemia.   This study reviewed with Dr. T. Arntson   Do not type on or below this line   Electronically Signed By:   CHARLES ASHBY, JR, M.D. 09/26/2008 08:23   ______________________________   EDWARD C. MILLER, M.D.   AK  D: 09/25/2008  T: 09/25/2008  5:55 P    [id number]   cc:   [facility]         EDWARD C. MILLER, M.D.

## 2008-09-26 NOTE — Procedures (Signed)
[facility]                              CARDIOLOGY DEPARTMENT                              ECHOCARDIOGRAM REPORT   NAME:  [name], [name]                         SS#:      [id number]   DOB:    Oct 10, 1956                                      AGE:     51   SEX:    F                                           ROOM#:  [id number]   MR#:    [id number]                                       DATE:    09/26/2008   BILLING# [id number]   Referring Physician:  Dr. Garen Grams   Reason For Study:  TIA with bubble   *There is a bubble associated with this examination   M-Mode Measurements (Parasternal)                 Measured   Normal   RVDd                                            36 mm   7-23 mm   IVSD                                            11 mm   7-11 mm   LVIDd                                           45 mm   37-54 mm   LVPWd                                           11 mm   7-11 mm   LVIDs                                           25 mm   Aortic Root Diameter                            30 mm  20-37 mm   ACS                                             mm          16-26 mm   Left Atrial Diameter                            47 mm   19-40 mm   2-Dimensional and Doppler Findings   PROCEDURES PERFORMED:  2-dimensional, color flow and Doppler analysis.   INDICATION:  TIA   FINDINGS   1. There is overall normal left ventricular systolic function and chamber       dimensions.  The estimated left ventricular ejection fraction is 60%.   2. Normal right heart size and function.   3. No evidence of intra-atrial septal defect.   4. Color flow and Doppler analysis demonstrates normal aortic, mitral,       tricuspid and pulmonic valve.   5. Bubble study was performed with no evidence of right to left shunting.   6. No pericardial effusion.   Electronically Signed By:   Donnel Saxon, M.D. 09/27/2008 07:57   __________________________________________________   Deniece Portela OLD, M.D.   M-Modes  Only Dictated by Aram Beecham, ECHOTECH   AK  D: 09/26/2008  T: 09/26/2008  7:26 P    [id number]/   cc:    [facility]          WAYNE OLD, M.D.

## 2008-09-26 NOTE — Procedures (Signed)
Test Reason : chest pain   Blood Pressure : ***/*** mmHG   Vent. Rate : 062 BPM     Atrial Rate : 062 BPM   P-R Int : 162 ms          QRS Dur : 094 ms   QT Int : 428 ms       P-R-T Axes : 040 026 049 degrees   QTc Int : 434 ms   Normal sinus rhythm   Nonspecific T wave abnormality   Abnormal ECG   When compared with ECG of 24-Sep-2008 16:41,   T wave inversion no longer evident in Anterior leads   Confirmed by Tory Emerald, M.D., Maruthi [phone]) on 09/27/2008 7:08:22 AM   Referred By: Vinnie Langton, MD           Overread By: Donella Stade, M.D.

## 2008-09-26 NOTE — Procedures (Signed)
Test Reason : chest pain   Blood Pressure : ***/*** mmHG   Vent. Rate : 062 BPM     Atrial Rate : 062 BPM   P-R Int : 162 ms          QRS Dur : 094 ms   QT Int : 428 ms       P-R-T Axes : 040 026 049 degrees   QTc Int : 434 ms   Normal sinus rhythm   Nonspecific T wave abnormality   Abnormal ECG   When compared with ECG of 24-Sep-2008 16:41,   T wave inversion no longer evident in Anterior leads   Confirmed by Gotti, M.D., Maruthi ([id number]) on 09/27/2008 7:08:22 AM   Referred By: Erik Kisa, MD           Overread By: Maruthi Gotti, M.D.

## 2008-09-26 NOTE — Procedures (Signed)
[facility]                              CARDIOLOGY DEPARTMENT                              ECHOCARDIOGRAM REPORT   NAME:  [name], [name]                         SS#:      [id number]   DOB:    Jul 12, 1956                                      AGE:     51   SEX:    F                                           ROOM#:  [id number]   MR#:    [id number]                                       DATE:    09/26/2008   BILLING# [id number]   Referring Physician:  Dr. Garen Grams   Reason For Study:  TIA with bubble   *There is a bubble associated with this examination   M-Mode Measurements (Parasternal)                 Measured   Normal   RVDd                                            36 mm   7-23 mm   IVSD                                            11 mm   7-11 mm   LVIDd                                           45 mm   37-54 mm   LVPWd                                           11 mm   7-11 mm   LVIDs                                           25 mm   Aortic Root Diameter                            30 mm  20-37 mm   ACS                                             mm          16-26 mm   Left Atrial Diameter                            47 mm   19-40 mm   2-Dimensional and Doppler Findings   PROCEDURES PERFORMED:  2-dimensional, color flow and Doppler analysis.   INDICATION:  TIA   FINDINGS   1. There is overall normal left ventricular systolic function and chamber       dimensions.  The estimated left ventricular ejection fraction is 60%.   2. Normal right heart size and function.   3. No evidence of intra-atrial septal defect.   4. Color flow and Doppler analysis demonstrates normal aortic, mitral,       tricuspid and pulmonic valve.   5. Bubble study was performed with no evidence of right to left shunting.   6. No pericardial effusion.   Electronically Signed By:   Donnel Saxon, M.D. 09/27/2008 07:57   __________________________________________________   Deniece Portela OLD, M.D.    M-Modes Only Dictated by Aram Beecham, ECHOTECH   AK  D: 09/26/2008  T: 09/26/2008  7:26 P    [id number]/   cc:    [facility]          WAYNE OLD, M.D.

## 2008-09-27 NOTE — Consults (Signed)
[facility]                               CONSULTATION REPORT                     CONSULTANT:  Regina Eck, [name].D.   NAME:       [name], [name]   BILLING #:  [id number]              DATE OF CONSULT:     09/26/2008   MR #:       [id number]               ADM DATE:            09/24/2008   SS #        [id number]            PT. LOCATION:        [id number]               DOB:  05/08/1956       AGE:  52             SEX:   F   ATTENDING:  [facility]   cc:    Ulla Gallo, [name].D.          Regina Eck, [name].D.          [facility]          Garen Grams, [name].D.          Hubert Azure, [name].D.   SS#: [id number]   CONSULTATION REQUESTED BY:  Dr. Marc Morgans, Hospitalist   REASON FOR REFERRAL   The patient was referred to cardiology for evaluation because of episodic   substernal chest discomfort.   HISTORY OF PRESENT ILLNESS   The patient is a  52 year old, African-American female with history of   systemic hypertension and hyperlipidemia, who was admitted to this hospital   on 09/24/2008 with intermittent episodes of substernal chest discomfort   that she describes as a heaviness and someone sitting on her chest.  This   is not associated with any nausea, vomiting, or diaphoresis.  These   episodes were occurring at random and not related to physical activities.   The patient was admitted to the hospital because of episodic chest   discomfort.   The patient states that associated with her chest discomfort she felt weak   on her right side and apparently she felt some numbness in her extremities.   The patient was admitted the hospital because of her evaluation of her   chest discomfort and right-sided weakness.  The patient underwent CT scan   of her head which demonstrated no evidence of infarction.   The patient had cardiac enzymes drawn and a 12-lead EKG was performed which   demonstrated no evidence of ischemia.  The patient underwent    pharmacological stress myocardial perfusion study on 09-25-08 which   demonstrated no evidence of ischemia.  Her left ventricular ejection   fraction by gated SPECT study was noted to be 67%..  The patient underwent   a 2-D echocardiographic study which demonstrated left ventricular ejection   fraction of about 60%.  The patient did have a bubble study done which   demonstrated no evidence of interatrial shunt.   The patient still has occasional episodes of substernal chest discomfort   despite negative stress myocardial perfusion study.  For  this reason   cardiac consultation was requested.   On questioning, the patient stated that she never had any known coronary   artery disease and exertion does not seem to bother her.   PAST MEDICAL HISTORY:  As noted above, is positive for   1. Systemic hypertension and hyperlipidemia.   2. She denies any diabetes mellitus.   3. The patient denies any prior myocardial infarction or rheumatic heart      disease.   4. She had a history of fibroid of uterus and required surgery.   PERSONAL HISTORY   The patient denies any history of smoking or alcohol abuse.   ALLERGIES   No known drug allergies.   SOCIAL HISTORY   The patient used to live in [place] and she moved away from [place]   because of [place] and she now lives and works in [place].   MEDICATIONS PRIOR TO ADMISSION:  Include   1. Metoprolol 50 mg p.o. b.i.d.   2. Lisinopril 20 mg daily.   FAMILY HISTORY:   Her father apparently had coronary artery disease and her mother suffered   from stroke.   REVIEW OF SYMPTOMS   NEUROLOGICAL:  As above.   CARDIAC:   As above.   PULMONARY:  Denies productive cough or hemoptysis.   GASTROINTESTINAL:   Denies any hematemesis or melena.   GENITOURINARY:  Negative.   MUSCULOSKELETAL:  Denies any claudication.   PHYSICAL EXAMINATION   GENERAL:  The patient is alert, appropriate, in no acute distress.    VITAL SIGNS:  Revealed pulse rate of 55 per minute, blood pressure 116/70   mmHg.  She is afebrile.  Respirations 18 per minute.   HEENT:  Sclerae anicteric.  Pupils are equal and reactive to light.   NECK:  Revealed no JVD.  Carotid upstroke is fair.   No bruits were noted.   CHEST:   Clear  to percussion and auscultation.   CARDIAC:  PMI is in the fifth intercostal space inside left midclavicular   line.  S1 and S2 normal.  No murmurs or gallops were appreciated.   ABDOMEN:  No organomegaly.  Bowel sounds present.   EXTREMITIES:   No edema.  Pedal pulses are intact.   NEUROLOGICAL:  No focal deficit.   LABORATORY DATA   Her BUN and creatinine were noted to be 13 and 0.9, serum potassium 4.2.   CPK and CK-MB were within normal limits.  Electrocardiogram demonstrated   sinus rhythm and nonspecific T-wave abnormalities in the anteroseptal   leads.   Noninvasive carotid studies revealed no flow-limiting carotid disease.   CLINICAL IMPRESSIONS   1. Episodic substernal chest discomfort that is atypical.   2. Negative pharmacological stress myocardial perfusion study for      ischemia.   3. Normal left ventricular systolic function by 2-D echocardiography and      there is no evidence of interatrial shunt.   4. Systemic hypertension.   5. Hyperlipidemia.   6. Exogenous obesity.   7. Apparent right-sided weakness without any evidence of carotid disease      and normal CT scan of her head.   DISPOSITION   Thank you for asking me to see this patient because of her episodic chest   discomfort.  Her chest discomfort is atypical; however, she has multiple   coronary risk factors.  I would recommend evaluating her noncardiac causes   of cp such as gastroesophageal reflux disease and evaluate her for any  evidence of pulmonary thromboembolic disease.  If no etiology is found and   if she has persistent episodes of chest discomfort, I would recommend   coronary angiography for a definitive diagnosis.    Thank you for asking me to see this patient.  Will follow with you during   this hospital stay.   Electronically Signed By:   Regina Eck, [name].D. 09/30/2008 09:30   ____________________________   Regina Eck, [name].D.   Lennon Alstrom  D:  09/27/2008  T:  09/28/2008  1:21 P   [id number]

## 2008-09-28 NOTE — Discharge Summary (Signed)
[facility]                                DISCHARGE SUMMARY                               Hubert Azure, M.D.   NAME:  [name], [name]   MR #:  [id number]                      ADM DATE:   09/24/2008   Tiburcio Bash [id number]                     DIS DATE:     09/27/2008   G#   SS #   [id number]                   DOB:  [birth year]   Hubert Azure, M.D.                   AGE:52                   SEX:  F   cc:    Ulla Gallo, M.D.          Hubert Azure, M.D.   DISCHARGE DIAGNOSES   1. Atypical chest pain possibly gastroesophageal reflux disease.   2. Transient subjective right arm weakness possibly transient ischemic      attack.   3. Hypertension.   4. Dyslipidemia.   DISCHARGE MEDICATIONS   1. Aspirin 81 mg daily.   2. Colace 100 mg b.i.d.   3. Lisinopril 10 mg daily.   4. Metoprolol 50 mg b.i.d.   5. Niacin 500 mg p.o. at bedtime.   6. Omeprazole 20 mg q.a.m.   7. Simvastatin 40 mg q.p.m.   HOSPITAL COURSE   The patient is a 52 year old female who presented to [facility] on the [facility]th with chest pain which she described as heaviness   and mid sternal as well as some transient right-sided weakness, right arm   weakness essentially.  When she came into the hospital in the emergency   room she had a head CT done to rule out a stroke.  Head CT was negative.   She had a 12-lead EKG which showed normal sinus rhythm, possibly left   atrial enlargement and T-wave abnormality, consider anterior ischemia.   However, when compared to an EKG done in 2006 there were no significant   changes.  She was admitted to telemetry for rule out acute coronary   syndrome and possible TIA or stroke.  She had three sets of cardiac   enzymes.  Slight initial bump in the initial CPK at 182, otherwise all   other sets of cardiac enzymes were completely negative.  Chest x-ray on   admission which showed that the heart was top normal size.  The lung fields    and pulmonary vasculature were normal.  She had a 2-D echocardiogram done   which showed normal left ventricular systolic function and chamber   dimensions, estimated left ventricular ejection fraction of 60%, normal   right heart size and function.  No evidence of intra-atrial septal defect.   Bubble study was performed with no evidence of right-to-left shunting.   Color flow and Doppler analysis demonstrates normal aortic, mitral and   tricuspid  pulmonic valves.  She had a slight decrease in her potassium on   admission 3.4 that was repleted.   The patient underwent a carotid Doppler ultrasound during the workup for   possible TIA.  Carotid Doppler ultrasound showed no evidence of significant   extracranial carotid or vertebral artery occlusive disease and a 1% to 49%   disease bilaterally.  The patient underwent a stress test on 09/25/2008   which showed normal gated SPECT nuclear stress test.  No evidence for scar   or ischemia and left ventricular ejection fraction of 67%.  The patient,   however, continued to have this chest pressure intermittently during this   admission.  Cardiology was then consulted to see the patient.  Based on the   fact that the patient's symptoms did not resolve despite a negative stress   test they recommended getting a CT scan of the chest to rule out noncardiac   causes.  Cardiology actually thought that the chest pain was probably   noncardiac in nature and recommended to get other tests to rule out   possibly noncardiac chest pain.  A CT scan of the chest was ordered.  The   patient however declined and did not want to go through with the test.  She   did have some concerns, according to the CT technician, regarding the use   of IV dye and so she was unable to perform that test.  She did have a   fasting lipid profile done in here during this workup.  A fasting lipid   profile showed a triglyceride of 253, total cholesterol of 222, HDL of 34    and LDL of 137.  She was then started on Zocor 40 mg daily as well as   niacin 500 at bedtime.   With regards to possible cerebrovascular accident the patient went for an   MRI of the brain; however, the test was inaccurate and unreliable and   difficult to complete due to the fact that the patient had raised her arm   and there was a lot of interference with the metal braces in her mouth with   the MRI machine.  She was unable to do that test.  She however did not have   any objective signs of weakness during this admission.  Currently the   patient states she is doing very well.  The chest pain had resolved.  She   however complains of some chronic constipation which she said has been   ongoing and chronic.  She is advised on a high-fiber diet.  She was giving   a prescription for Colace 100 mg b.i.d.  She also stated that she had   cardiac catheterization done 3 years ago which was completely normal.  She   was advised that if she continued to have this chest pain or if she had a   repeat, otherwise she would follow up with her primary care doctor, Dr.   Ulla Gallo, in about 1 to 2 weeks.  She has also been advised on proper   diet, low-cholesterol diet and moderate exercise.   Electronically Signed By:   Lorain Childes, M.D. 10/12/2008 19:38   ____________________________   Hubert Azure, M.D.   LE  D:  09/28/2008  T:  09/29/2008  3:31 P   [id number]

## 2008-10-01 NOTE — Procedures (Signed)
Test Reason : HEADACHE AND HEAVINESS ON SIDE OF FACE   Blood Pressure : ***/*** mmHG   Vent. Rate : 058 BPM     Atrial Rate : 058 BPM   P-R Int : 164 ms          QRS Dur : 090 ms   QT Int : 446 ms       P-R-T Axes : 035 033 059 degrees   QTc Int : 437 ms   Sinus bradycardia   Otherwise normal ECG   When compared with ECG of 26-Sep-2008 16:39,   No significant change was found   Confirmed by Gotti, M.D., Maruthi ([id number]) on 10/02/2008 9:48:43 AM   Referred By: Ben Fickenscher, MD           Overread By: Maruthi Gotti, M.D.

## 2008-10-01 NOTE — Procedures (Signed)
Test Reason : HEADACHE AND HEAVINESS ON SIDE OF FACE   Blood Pressure : ***/*** mmHG   Vent. Rate : 058 BPM     Atrial Rate : 058 BPM   P-R Int : 164 ms          QRS Dur : 090 ms   QT Int : 446 ms       P-R-T Axes : 035 033 059 degrees   QTc Int : 437 ms   Sinus bradycardia   Otherwise normal ECG   When compared with ECG of 26-Sep-2008 16:39,   No significant change was found   Confirmed by Tory Emerald, M.D., Maruthi ([id number]) on 10/02/2008 9:48:43 AM   Referred By: Dixie Dials, MD           Overread By: Donella Stade, M.D.

## 2008-10-01 NOTE — ED Provider Notes (Signed)
[facility]                      EMERGENCY DEPARTMENT TREATMENT REPORT   NAME:  [name], [name]        PT. LOCATION:    ER  [id number]       DOB:  [birth year]                                                                     AGE:  51   MR #:       BILLING #:           DOS: 10/01/2008  TIME:11:57 P   SEX:  F   [id number]    [id number]   cc:    Ulla Gallo, M.D.   Primary Physician:   Primary Physician:   CHIEF COMPLAINT   Weakness.   HISTORY OF PRESENT ILLNESS   A 52 year old black female states she had an headache earlier this morning.   She took a nap, she awoke approximately 11 hours ago with right-sided   facial heaviness.  She also complaints of a 2-week history of right arm   weakness.  The patient denies any chest pain, denies any shortness of   breath.  She was admitted to the hospital last week for these same   symptoms.  At that time, she underwent a full evaluation including   echocardiogram and carotid Dopplers, which were all normal and at that   time, also she did have some chest pain, and underwent a cardiac rule out   protocol, which was negative.  The patient denies chest pain at this time   though.   REVIEW OF SYSTEMS   CONSTITUTIONAL:  No fever, chills, weight loss.   EYES: No visual symptoms.   ENT: No sore throat, runny nose or other URI symptoms.   ENDOCRINE:  No diabetic symptoms.   HEMATOLOGIC/LYMPHATIC:  No excessive bruising or lymph node swelling.   ALLERGIC/IMMUNOLOGIC:  No urticaria or allergy symptoms.   RESPIRATORY:  No cough, shortness of breath, or wheezing.   CARDIOVASCULAR:  No chest pain, chest pressure, or palpitations.   GASTROINTESTINAL:  No vomiting, diarrhea, or abdominal pain.   GENITOURINARY:  No dysuria, frequency, or urgency.   MUSCULOSKELETAL:  No joint pain or swelling.   INTEGUMENTARY:  No rashes.   Denies complaints in any other system.   PAST MEDICAL HISTORY   Hypertension.   SOCIAL HISTORY   Does not use tobacco.  Does not use alcohol.    PAST SURGICAL HISTORY   Fibroidectomy.   FAMILY HISTORY   Noncontributory.   ALLERGIES   No known drug allergies.   MEDICATIONS   Metoprolol, Zocor, aspirin, and lisinopril.   PHYSICAL EXAMINATION:   VITAL SIGNS:  Blood pressure 121/60, pulse 65, respirations 20, temperature   97.9, and pulse oximetry 95% on room air.   GENERAL:  This is an obese black female who is alert, oriented, and in no   acute distress.   HEENT:  Eyes:  Conjunctivae clear, lids normal.  Pupils equal, symmetrical,   and normally reactive. Ears/Nose:  Hearing is grossly intact to voice.   Internal and external examinations of the ears and nose are unremarkable.  Mouth/Throat:  Surfaces of the pharynx, palate, and tongue are pink, moist,   and without lesions. Nasal mucosa, septum, and turbinates unremarkable.   Teeth and gums unremarkable.   NECK:  Supple, nontender, symmetrical, no masses or JVD, trachea midline.   Thyroid not enlarged, nodular, or tender. No cervical or submandibular   lymphadenopathy palpated.   RESPIRATORY:  Clear and equal breath sounds.  No respiratory distress,   tachypnea, or accessory muscle use.   CARDIOVASCULAR:  Heart regular, without murmurs, gallops, rubs, or thrills.   PMI not displaced. No peripheral edema or significant varicosities.   Vascular:   Calves soft and nontender.  No peripheral edema or significant   varicosities. Carotid, femoral, and pedal pulses are satisfactory.  The   abdominal aorta is not palpably enlarged.   GI:  Abdomen soft, nontender, without complaint of pain to palpation.  No   hepatomegaly or splenomegaly. No abdominal or inguinal masses appreciated   by inspection or palpation.   MUSCULOSKELETAL: Nails:  No clubbing or deformities.  Nail beds pink with   prompt capillary refill.   SKIN:  Warm and dry without rashes.   NEUROLOGIC:  Alert, oriented.  Sensation intact, motor strength equal and   symmetric. Patient's cranial nerves all appear to be fully intact.  There    is no right-sided facial droop noted.  The patient may have slight weakness   in the right upper extremity, but this is very subtle.   PSYCHIATRIC:  Judgment appears appropriate.   INITIAL ASSESSMENT/MANAGEMENT PLAN   This is a new problem for this patient.  The patient is complaining of what   sounds like a TIA, which she would like to be evaluated for same.   DIAGNOSTIC STUDIES   An EKG showed sinus bradycardia, but no ischemic changes.  INR was 1.0.   Albumin 4.2, calcium 9.7, chloride 96, creatinine 1.3, glucose 102,   alkaline phosphorous 77, potassium 3.6, sodium 134, SGOT of 20, CO2 of 27,   and BUN of 15.  CBC, white blood cell count of 5.8, hemoglobin 13.6,   hematocrit 37.9, and platelets 351.  CT scan of the head was normal.   COURSE IN THE EMERGENCY DEPARTMENT   On re-evaluation, the patient remained stable and comfortable throughout   the remainder of her stay.  She had absolutely no further symptom while in   the emergency department.   CLINICAL IMPRESSION/DIAGNOSIS   Cerebrovascular accident.   DISPOSITION/PLAN   The patient was discussed with Dr. Lendell Caprice who is on call for Dr. Lafayette Dragon.   We agree the patient has had a full workup including echocardiogram and the   other studies in that little would be gained by admitting her to the   hospital since she is currently asymptomatic.  We will add Plavix to her   medication regimen and she is advised to follow up with Dr. Lafayette Dragon as soon as   possible.  The patient has been advised to return immediately should her   condition worsen.   Electronically Signed By:   Orma Flaming, M.D. 10/02/2008 21:23   ____________________________   Orma Flaming, M.D.   My signature above authenticates this document and my orders, the final   diagnosis(es), discharge prescription(s) and instructions in the [facility] record.   TW  D:  10/01/2008  T:  10/02/2008 11:18 A   [id number]/

## 2008-12-09 NOTE — Consults (Signed)
[facility]                               CONSULTATION REPORT                       CONSULTANT:  Alanda Amass, [name].D.   NAME:       [name], [name]   BILLING #:  [id number]              DATE OF CONSULT:     12/09/2008   MR #:       [id number]               ADM DATE:            12/09/2008   SS #        [id number]            PT. LOCATION:               DOB:  July 20, 1956       AGE:  52             SEX:   F   ATTENDING:  Alanda Amass, [name].D.   cc:    Donnel Saxon, [name].D.          Alanda Amass, [name].D.        *Johnette Abraham, MD   DATE OF CONSULTATION   12/09/2008   REFERRING PHYSICIAN   Johnette Abraham, [name].D.   REASON FOR CONSULTATION   Consideration for surgical weight loss.   HISTORY OF PRESENT ILLNESS   The patient is a very pleasant 52 year old African-American female who   weighs 225.5 pounds on her 5 foot 1 inch frame.  This gives her a body mass   index of 42.6.  At age 65, she reports she weighed 110 pounds.  She is G2,   P2 with 2 spontaneous vaginal deliveries.  She reports that she was unable   to lose all her pregnancy-induced weight and she had a slow gradual rise,   despite multiple diet attempts to lose weight, resulting in a "yo-yo"   fluctuations in her weight, but overall a gradual increase to its current   level.  She has had multiple attempts in the past to lose weight.  These   include seeing a Dr. Arlyss Queen when she lived in [place] in 1996 to   1997. At that time she was prescribed Fen-Fen, the oral diet medication.   She reports that she was successful in losing 20 pounds; however, when it   was taken off of the market, she regained all that weight.  Since that time   she has been on Weight Watchers with the weekly meetings for a 4-mont   period in 1999, losing 15 pounds.  Unfortunately she regained that as well.   More recently however, she has been on the Atkins diet in 2005, again for 4    months, losing 30 pounds.  However, that too she did regain.  She has also   been a member of [employer] which she reports she had not been faithful, and   that unfortunately did not result in any weight loss either.  She report   that she has not been working with her primary care physician, Dr. Johnette Abraham, specifically regarding weight loss.   PAST MEDICAL HISTORY   Significant for hypertension, gastroesophageal reflux disease (she  has no   symptoms on her current medications). She also reports hyperlipidemia.   Significantly she has no history or diabetes or gestational diabetes.   Please also note that she was admitted to [facility] between September 30 and October 4 complaining of chest pain with   possible syncope.  She had an extensive workup there including an EKG   showing normal sinus rhythm.  She also had a nuclear stress test showing a   normal EF of 67% with no evidence of scar or ischemia.  In addition, she   had cerebral vascular Duplex done for syncope which showed no evidence of   significant extracranial carotid or vertebral artery disease.  Finally she   also had a echocardiogram signed by Dr. Marisue Ivan showing a normal left   ventricular systolic function and chamber dimensions, ejection fraction of   60%.  Normal right heart size and function with normal aortic mitral   tricuspid and pulmonary valve.  Bubble study was negative and no   pericardial effusion. In essence, she had a negative cardiology workup.   CURRENT MEDICATIONS   Her current medication regime includes:   1. Toprol 50 mg twice a day.   2. Lisinopril 10 mg once a day.   3. Prilosec 20 mg once a day.   ALLERGIES   She reports no known drug allergies.   PAST SURGICAL HISTORY   Included initially a laparoscopic surgery for fibroids for infertility and   more recent an open fibroid removal, and this was done for vaginal   bleeding.  She reports a hysterectomy was not done.   FAMILY HISTORY    Significant for mother having diabetes, hypertension, who was obese.  Her   father had coronary artery disease status post coronary artery bypass   grafting.  He also had chronic renal insufficiency and is on dialysis.  She   has 2 sisters both with diabetes and hypertension, and in addition a   brother with diabetes and hypertension as well.   SOCIAL HABITS   She has never smoked.  She does not drink alcohol.  She is a Doctor, hospital at [employer].   REVIEW OF SYSTEMS   I have noted the hypertension.  She has no history of coronary artery   disease, atrial fibrillation or congestive heart failure.  She denies   asthma or COPD.  She does use her treadmill at home at 3.5 miles/hour.  She   does occasionally get some mild shortness of breath at this level.  She   reduces the rate but gradually can work it back up to this level with no   problems.  She denies sleep apnea, thyroid disease, or symptoms to suggest   cholelithiasis.  Her GERD symptoms are resolved on her proton-pump   inhibitors.  She has no history of ulcers, transfusion, no significant   arthropathy or lower back pain.  No history of sciatica.  No venous stasis   problems.  No history of DVT or PE.  No anemia, easy bleeding, depression,   stroke, migraines, kidney stones, urinary stress incontinence, or history   of polycystic ovarian syndrome.  She has not had a colonoscopy, and her   last mammogram was 2007.  She reports that her primary care physician is   planning to get these studies done.  Her last Pap testing was in 05/2008   which she reports as being normal.   PHYSICAL  EXAMINATION   GENERAL APPEARANCE:  She is a well-appearing woman in no distress.  She has   a marked android body habitus with a protuberant abdomen.   VITAL SIGNS:  Her blood pressure was 142/87, pulse 79, temperature 98.3,   respirations 16.  Her waist circumference was 46 inches.   HEENT/NECK:  Notable for normal extra-ocular movement of her eyes. On  open   mouth exam, I could just see the very top of her uvula.  There were no   obvious oral lesions.  Neck exam reveals no adenopathy, thyromegaly or neck   bruits.   LUNGS:  Clear without wheezing.   HEART:  Rate was regular without murmur.   ABDOMEN:  Soft with a well-healed Pfannenstiel incision as well as   infraumbilical curvilinear trocar incision.  There was no tenderness or   hernias on Valsalva.  She has no palpable tenderness or masses in any other   part of her abdomen.   EXTREMITIES:  On peripheral exam, she had no edema and good warm pulses in   her feet.   LABORATORY DATA   Laboratory study available from her primary care physician reveals a CMP   done on October 14, 2008.  The patient reports that this was a fasting   level. Her glucose was elevated at 124.  BUN and creatinine were 12 and   1.05.  Sodium 141, potassium 3.9, chloride 100, CO2 of 25, calcium was 9.6,   total protein 7.5 with an albumin of 4.6.  Total bilirubin of 0.6.  AST and   ALT were 17 and 8 respectively and alkaline phosphatase was 51.   Going back to 09/22/2008, a CBC shows a white blood cell count of 5.8,   hematocrit 35.7, hemoglobin 12.3, MCV 95, a platelet count of 294,000.  A   CMP done at that time, shows a glucose of 88 normal.  A lipid panel done on   09/22/2008 shows an elevated cholesterol at 233, triglycerides were 220 with   a depressed HDL of 36.   Laboratory done at [facility] from October 01, 2008   show a PT of 13, PTT of 28.1, and INR of 1.  She had troponin levels on   October 1st x 2 which were normal.   ASSESSMENT   This is a morbidly obese woman with metabolic syndrome.  Her other   comorbidities include hypertension, reflux and hyperlipidemia.  She does   meet the [employer] of Health 1991 criteria for weight loss   surgery.  I have seen her recently at an information session, and in great   detail I have presented her options of adjustable gastric banding versus    the laparoscopic gastric bypass.  She is partial to the former procedure.   Again, today I reviewed both these procedures to her as well as the   possible risks and complications of each surgery and complications of the   band including slippage, erosion, esophageal dilatation.  She is aware of   the need for lifelong office adjustments in order to obtain good results   with the band as well as its eating restrictions.  I have provided her with   information about the two manufacturers who make the band, and I will   initiate the evaluation for weight loss surgery which will include   consultations with the nutritionist and health psychologist.  Also at   today's visit, she had her vitamins levels checked including  B12, folate,   ferritin, thiamine, 25-hydroxy, Vitamin D, H. pylori antibody, TSH for   hypothyroid screening, as well as hemoglobin A1c.  I am not clear why the   one laboratory on October 20th showed such a high glucose. The patient   vehemently reports that this was a fasting level; however, as I mentioned,   review of records show multiple other glucoses in the normal range done in   the early morning hours while she was hospitalized, indicating that these   were indeed fasting as well. I believe her cardiac workup has been quite   complete, and I am confident she does not have significant coronary artery   disease.  I perform screening colonoscopies and I offered to do this for   her.   I desire that she have this study done before any elective weight   loss surgery.  She will contact me if her PCM cannot arrange for one.   I will see her back after the workup as defined above.   Electronically Signed By:   Alanda Amass, [name].D. 12/11/2008 04:40   ____________________________   Alanda Amass, [name].D.   [facility]  D:  12/10/2008  T:  12/10/2008  8:47 P   [id number]

## 2009-01-07 LAB — CONVERTED CEMR LAB: Pap Smear: NORMAL

## 2009-02-05 NOTE — H&P (Signed)
[facility]                              HISTORY AND PHYSICAL                             Alanda Amass, M.D.   NAME:    [name], [name]   MR #:    [id number]                  ADM DATE:          02/19/2009   BILLING                            PT. LOCATION   #:   SS #                               DOB:  [birth year]   AGE:  52   Alanda Amass, M.D.             SEX:  F   cc:    Donnel Saxon, M.D.          Ulla Gallo, M.D.          Alanda Amass, M.D.   PREOPERATIVE DIAGNOSIS   Morbid obesity, Body Mass Index is 42.3.   COMORBIDITIES   Include hypertension, gastroesophageal reflux, hyperlipidemia.  She does   have a diagnosis of metabolic syndrome.   PLANNED PROCEDURE   Laparoscopic adjustable gastric band with the Ethicon Realize band.   HISTORY OF PRESENT ILLNESS   The patient is a very pleasant 53 year old African-American female who   today weighs 224.25 pounds on her 5'1" frame.  This gives her a Body Mass   Index of 42.3.  At age 22, she weight 110 pounds.  She is G2, P2 with two   spontaneous vaginal deliveries.  She reports that she was unable to lose   all her pregnancy-induced weight, and she had a slow gradual rise despite   multiple dieting attempts to lose her weight.  This has resulted in "yo-yo"   fluctuations in her weight, but overall a gradual increase to its current   level.  She has had multiple attempts to lose weight in the past.  These   include seeing Dr. Arlyss Queen when she lived in [place] between   [phone].  She was prescribed Fen-phen, the oral medication.  She was   successful in losing 20 pounds, however, when it was taken off the market,   she rapidly regained the weight.  Since that time, she has been on Weight   Watchers at weekly meetings for a 74-month period in 1999 losing 15 pounds.   Unfortunately, she regained that weight as well.  More recently, she has    been on the Atkins diet in 2005, again over 4 months, and this time lost 30   pounds.  This too was regained.  She has been a member of [employer], which she   reports has not been too faithful, but this has not resulted in any weight   as well.  Her primary care physician is          Dr. Johnette Abraham.   PAST MEDICAL HISTORY   Includes hypertension, gastroesophageal reflux disease (she has no symptoms  on her current medications).  She also reports hyperlipidemia.  She has no   significant history of gestational diabetes.  She has been diagnosed with   metabolic syndrome as well and low vitamin D levels for which she is now   under replacement.  Review of records has shown that she was admitted to   [facility] between 09/24/2008-09/28/2008 complaining   of chest pain with possible syncope.  She had an extensive workup there   including an EKG showing normal sinus rhythm as well as a nuclear stress   test showing ejection fraction of 67% with no evidence of scar ischemia.   In addition, she had a cerebrovascular duplex done for the syncope which   showed no evidence of significant extracranial carotid or vertebral   disease.  Finally, she also had an echocardiogram by Dr. Marisue Ivan   showing a normal left ventricular systolic function as well as chamber   dimensions with an ejection fraction of 60.  She had normal right heart   size and function, normal aortic tricuspid and pulmonary valves.  Bubble   study was done as well and it was negative.  There was no pericardial   effusions.  In essence, she had a negative cardiac workup.   CURRENT MEDICATIONS   1. Toprol 50 mg twice a day.   2. Lisinopril 10 mg once a day.   3. Prilosec 20 mg in the morning.   4. I have started her on ergocalciferol 50,000 units once a week.   5. She takes an aspirin once a day for which she has started herself.  This      was for no specific reason.   6. She also takes a multivitamin once a day.   ALLERGIES    She reports no known drug allergies.   PAST SURGERIES   These have included a laparoscopic surgery for fibroids for infertility,   and most recent she had an open fibroid removal; this was done for vaginal   bleeding.  She reports a hysterectomy was not done.   FAMILY HISTORY   Significant for her mother having diabetes and hypertension who was obese.   Her father had coronary disease status post coronary artery bypass   grafting.  He also has chronic renal insufficiency and is on dialysis.  She   has 2 sisters who have diabetes and hypertension, and in addition a brother   with diabetes and hypertension as well.   SOCIAL HABITS   She has never smoked.  She does not drink alcohol.  She is a Doctor, hospital at [employer].   REVIEW OF SYSTEMS   Notable for her hypertension.  She has no history of coronary artery   disease, atrial fibrillation, or congestive heart failure.  She denies   asthma or COPD.  She does use her treadmill at home at      3.5 miles an   hour.  She does get occasionally some mild shortness of breath at this   level.  She reduces the rate, but gradually can work back up to this level   with no problems.  She denies sleep apnea, thyroid disease or symptoms to   suggest cholelithiasis.  Her reflux symptoms are resolved her protein pump   inhibitor.  She has no history of ulcers or transfusions.  No significant   arthropathy or lower back pain.  No history of sciatica or venous stasis   problems.  No history of DVT or PE.  She denies anemia, easy bleeding,   depression, stroke, migraines, kidney stones, urinary stress incontinence,   and no history of polycystic ovarian syndrome.  I have performed a   screening colonoscopy on her on 01/21/2009.  This showed some nonbleeding   internal hemorrhoids on retroflexed view for which I have recommended she   keeps her stools soft with plenty of fiber and avoid dehydration.  She had    no polyps, no diverticula seen.  Her last mammogram was 2007.  She reports   her primary care physician is planning to get a more recent study done.   Her last Pap testing was 05/2008 which she reports was normal.   PHYSICAL EXAMINATION   GENERAL:  She is a well-appearing woman in no distress.  She has marked   android body habitus with a protuberant abdomen.   VITAL SIGNS:  Today were 138/84, pulse of 74, temperature 97.4,   respirations 18.   HEAD AND NECK:  Unremarkable.  There are no neck bruits, adenopathy or   thyromegaly.  On open-mouth exam, I can see the top but not the inferior   portion of her uvula.  She has no significant oral lesions.  Her   extraocular movement of her eyes and pupillary light reflexes were normal.   LUNGS:  Clear without wheezing.   HEART:  Rate is regular without murmur.   ABDOMEN:  Soft and nontender with no masses and no hernias felt.  She has a   well-healed low Pfannenstiel incision.   EXTREMITIES:  She has no peripheral edema of her extremities, full pulses   in her feet with no venous hypertensive changes.   I have mentioned to you her colonoscopy report.   Her laboratory studies by her primary care physician done at [facility] on 10/14/08 showed a CMP which is only notable for a glucose of   124.  This was done at 1441, and therefore, it was not a fasting sample.   Her sodium was 141, potassium 3.9, chloride 100, CO2 of 25, BUN and   creatinine were 12 and 1.05, calcium was 9.6, total protein of 7.5 with an   albumin of 4.6, AST and ALT were 17 at 8 - normal, alkaline phosphatase of   51, and total bilirubin of 0.6.  CBC had a white count of 5.8, hematocrit   of 35.7, MCV of 95, and a platelet count of 294,000.  This study was done   on 09/22/2008.  Her lipid panel was also done on that date and was notable   for a cholesterol of 233, triglycerides of 220, and an HDL depressed at 36.   I have checked vitamin levels on her.  These tests were done on 12/09/08.    Her hemoglobin A1c was 5.2.  Her TSH level was 1.99.  A B12 level was 371,   a folate of 12.8, and a ferritin level at 26.2.  Her vitamin D level was   quite low at 9, and she was started on ergocalciferol 50,000 units once a   week for which she is currently on.  Her H.  Pylori antibody screen was   positive.  Because she desires a laparoscopic band and not to a gastric   bypass,  this will not be treated given her asymptomatic state and because   in the future if she were to develop symptoms or bleeding that her duodenum   would  be available for interrogation endoscopically.  My personal   preference is only to treated a H. pylori antibody positivity if the person   is planning on having a gastric bypass at which the above would not be   possible afterward.  I had mentioned to you her cardiology workup as noted   above.  I do have an EKG from 09/26/2008 which is normal sinus rhythm,   ventricular rate of 62.   MY ASSESSMENT   This is a morbidly obese woman with the comorbidities listed above.  She is   a candidate for weight loss surgery.  She has been seen by our nutritionist   on multiple occasions, and after additional education, has been felt by   R.D. Carrie Palamarchuck to be a good candidate for gastric banding.  This   is reflected in her 01/21/2009 note.  She has also had psychological   screening by clinical psychologist, Ruel Favors who feels she has also a good   candidate for weight loss surgery.  At this point, I recommend she stop her   aspirin.  I will put her on a 2-week period liver-shrinking diet to help   improve the success of the surgery, and at today's meeting, we have gone   over in great detail the planned placement of Realize laparoscopic   adjustable band along with its possible risks and complications including   bleeding, infection, damage to surrounding organs, the possibility of band   slippage, erosion, obstruction, esophageal dilatation, the need for    continual adjustments will be lifelong after surgery.  She will stay   overnight and have an upper GI study the following day and be discharged   home on a liquid diet for two additional weeks.  This will then be   transitioned to a soft diet for 2 weeks and finally a regular diet, and at   the 6 weeks post implantation point, she will return for her first band   fill.  We have also discussed the possibilities of port-site complications   as well as the possibility of poor weight loss with band placement.  She is   aware the need to measure her foods initially as well as failure of weight   loss may occur with any weight-loss surgery; specifically, if one were to   drink high-caloric liquids and snack and nibble throughout the day.  She is   aware that once she heals, a regular exercise program will also be   recommended.  We have also talked about the risks of deep venous thrombosis   and vitamin deficiencies, and I would recommend to her continuing her   multivitamin after surgery.  Again, I will have her stop her daily aspirin,   put her on a liver-shrinking diet for 2 weeks, and I will get a more recent   CBC and Comprehensive Metabolic Panel given the ones I have are over 3   months old.   Electronically Signed By:   Alanda Amass, M.D. 02/06/2009 09:48   ____________________________   Alanda Amass, M.D.   GM  D:  02/05/2009  T:  02/05/2009  4:13 P   [id number]

## 2009-02-19 NOTE — Op Note (Signed)
[facility]                                OPERATION REPORT                        SURGEON:  Alanda Amass, M.D.   [facility] [name], [place]   E:   MR  [id number]                         DATE:            02/19/2009   #:   Lindley Magnus  [id number]                      PT. LOCATION:    [id number]   #   Alanda Amass, M.D.   cc:    Ulla Gallo, M.D.          Alanda Amass, M.D.   PREOPERATIVE DIAGNOSIS   Morbid obesity, BMI is 42.3.  Comorbidities include gastroesophageal reflux   disease, hyperlipidemia, hypertension and metabolic syndrome.   POSTOPERATIVE DIAGNOSIS   Morbid obesity, BMI is 42.3.  Comorbidities include gastroesophageal reflux   disease, hyperlipidemia, hypertension and metabolic syndrome.   PROCEDURE PERFORMED   Laparoscopic adjustable gastric band placement with REALIZE-C band.   FINDINGS   Normal anatomy.   PRIMARY SURGEON   Alanda Amass, M.D.   ANESTHESIA   General endotracheal anesthesia   INDICATIONS FOR PROCEDURE   This patient is a very pleasant 53 year old Afro-American female who is 5   foot 1 and weighs 224 pounds.  This gives her a body mass index of 42.3.   She desires surgical assistance for permanent weight loss in that she has   failed multiple conservative means to lose weight.  Her preoperative   comorbidities are listed above.  After consultation, she desires the   adjustable gastric band placement.   DESCRIPTION OF OPERATION   After consent was obtained, the patient was brought to the operating room.   She had been given 5,000 units of heparin subcutaneously as well as 2 grams   of Mefoxin IV antibiotics preoperatively.  Once in the operating room,   general endotracheal anesthesia was administered.  A Foley catheter was   placed since she could not void preoperatively.  Her abdomen was prepped   and draped in the usual sterile fashion.  A small incision was made in the   left upper quadrant through which a Veress needle was passed.    Pneumoperitoneum to 50 mmHg was created.  The Veress needle was removed and   a 15 mm bladeless Ethicon trocar was inserted.  Through this, a 10 mm 45   degree laparoscope was passed.  It showed no evidence of insertion trauma.   Three additional 5 mm bladeless trocars were inserted ;one in the right   upper quadrant, one near midline and one in the left upper quadrant.  The   patient was put in a steep reversed Trendelenburg position and another stab   incision was made in the epigastrium.  The [facility] liver retractor was   placed underneath the left lobe of the liver and was put on anterior   stretch.  The anesthesiologist then placed the orogastric tube with the   REALIZE band kit and the large esophageal balloon was inflated to 12 mL of   air.  It was pulled back and the balloon remained in the stomach with no   evidence of it herniated into the chest indicating no significant hiatal   hernia.  The air was then released and this orogastric tube was removed.   The initial step was to use the hook cautery to make a small opening in the   peritoneum just overlying the left crus at the angle of Hess and through   this, the gold finger dissector was placed along the muscle and the exit   area for the band tunnel was created.  Next, attention was turned overlying   the right crus.  We went through the thin portion of the gastrohepatic   omentum.  There was a large crossing artery going to the liver and we   stayed beneath this and took care not to damage it.  Again, using the hook   cautery, a small opening was made in the peritoneum.  Then, using two   graspers, the entrance tunnel was started hugging the left crus muscle.   Once the tunnel was created, the gold finger was also placed within this   and staying posterior to the stomach and only a slight curve on the   instrument, it was then gently inserted making the retrogastric tunnel.   When the tip was seen at the left crus at the angle of Hess, angulation  was   then made and then the gold finger was then brought up, thereby completely   encircling the superior portion of the stomach posteriorly.  Next, the   REALIZE-C adjustable gastric band was placed within the abdomen through the   15 mm port.  The string was hooked around the gold finger and with removing   the gold finger detector device, the adjustable gastric band was placed   around the superior portion of the stomach.  It was placed in 180 degrees   and needed to be flipped, which was done.  Next, the end of the band was   placed through the buckle and snapped.  We then used traction on this   buckle going to the right side of the abdomen and caudally, thereby showing   [place] the aim point on the superior portion of the stomach for the suturing.   Three plication sutures were used using the EndoStitch with permanent   Surgidac suture.  Using EndoStitch in place, initial attempts with a free   needle proved to be too difficult and the suture was too short.  In any   event, the free needle was removed, the EndoStitch was brought into the   case and the plication was done uneventfully.  We made sure it was a nice   floppy loose wrap which was only partially across the band anteriorly.  It   did not extend to the right where the buckle lay.  Next, the end part of   the buckle which was excised as is the norm was removed.  The [facility]   liver retractor was removed, the patient was flattened and all trocars and   pneumoperitoneum was released.  In the next part of the case, a pocket was   created for placement of the port.  To do this, a 15 mm trocar site was   extended slightly at the skin and a Bovie cautery was used to free up the   fat, allowing exposure of the rectus fascia anteriorly and lateral to the   trocar site into the  abdomen.  The Ethicon port was then connected to the   tubing and excess tubing was replaced in the abdomen.  Finally, the port    was snapped into place with its applier.  Digital exam showed it to be   firmly fixed at all four points.  The area was irrigated and no bleeding   was seen.  All excess loose globules of fat were removed and finally, the   skin incision at this point was closed with running 3-0 Vicryl suture.   Monocryl was used for all the small incisions.  Steri-Strips, gauze and   Tegaderm were used to dress the wounds.  The patient was awakened from   anesthesia, extubated and brought to the recovery room in excellent   condition with no known complications.  Blood loss was minimal.  She   received 2 liters of Ringer's Lactate.  The urine output was via the Foley   catheter which was removed at the end of the case.  There were no known   complications.  There were no specimens submitted.   Electronically Signed By:   Alanda Amass, M.D. 02/20/2009 12:43   ____________________________   Alanda Amass, M.D.   Seleta Rhymes  D:  02/19/2009  T:  02/20/2009  9:31 A   [id number]

## 2009-02-19 NOTE — Op Note (Signed)
[facility]                                OPERATION REPORT                        SURGEON:  Alanda Amass, M.D.   [facility] [name], [place]   E:   MR  [id number]                         DATE:            02/19/2009   #:   [name]  [id number]                      PT. LOCATION:    [id number]   #   Alanda Amass, M.D.   cc:    Ulla Gallo, M.D.          Alanda Amass, M.D.   PREOPERATIVE DIAGNOSIS   Morbid obesity, BMI is 42.3.  Comorbidities include gastroesophageal reflux   disease, hyperlipidemia, hypertension and metabolic syndrome.   POSTOPERATIVE DIAGNOSIS   Morbid obesity, BMI is 42.3.  Comorbidities include gastroesophageal reflux   disease, hyperlipidemia, hypertension and metabolic syndrome.   PROCEDURE PERFORMED   Laparoscopic adjustable gastric band placement with REALIZE-C band.   FINDINGS   Normal anatomy.   PRIMARY SURGEON   Alanda Amass, M.D.   ANESTHESIA   General endotracheal anesthesia   INDICATIONS FOR PROCEDURE   This patient is a very pleasant 53 year old Afro-American female who is 5   foot 1 and weighs 224 pounds.  This gives her a body mass index of 42.3.   She desires surgical assistance for permanent weight loss in that she has   failed multiple conservative means to lose weight.  Her preoperative   comorbidities are listed above.  After consultation, she desires the   adjustable gastric band placement.   DESCRIPTION OF OPERATION   After consent was obtained, the patient was brought to the operating room.   She had been given 5,000 units of heparin subcutaneously as well as 2 grams   of Mefoxin IV antibiotics preoperatively.  Once in the operating room,   general endotracheal anesthesia was administered.  A Foley catheter was   placed since she could not void preoperatively.  Her abdomen was prepped   and draped in the usual sterile fashion.  A small incision was made in the   left upper quadrant through which a Veress needle was passed.    Pneumoperitoneum to 50 mmHg was created.  The Veress needle was removed and   a 15 mm bladeless Ethicon trocar was inserted.  Through this, a 10 mm 45   degree laparoscope was passed.  It showed no evidence of insertion trauma.   Three additional 5 mm bladeless trocars were inserted ;one in the right   upper quadrant, one near midline and one in the left upper quadrant.  The   patient was put in a steep reversed Trendelenburg position and another stab   incision was made in the epigastrium.  The [facility] liver retractor was   placed underneath the left lobe of the liver and was put on anterior   stretch.  The anesthesiologist then placed the orogastric tube with the   REALIZE band kit and the large esophageal balloon was inflated to 12 mL of   air.  It was pulled back and the balloon remained in the stomach with no   evidence of it herniated into the chest indicating no significant hiatal   hernia.  The air was then released and this orogastric tube was removed.   The initial step was to use the hook cautery to make a small opening in the   peritoneum just overlying the left crus at the angle of Hess and through   this, the gold finger dissector was placed along the muscle and the exit   area for the band tunnel was created.  Next, attention was turned overlying   the right crus.  We went through the thin portion of the gastrohepatic   omentum.  There was a large crossing artery going to the liver and we   stayed beneath this and took care not to damage it.  Again, using the hook   cautery, a small opening was made in the peritoneum.  Then, using two   graspers, the entrance tunnel was started hugging the left crus muscle.   Once the tunnel was created, the gold finger was also placed within this   and staying posterior to the stomach and only a slight curve on the   instrument, it was then gently inserted making the retrogastric tunnel.   When the tip was seen at the left crus at the angle of Hess, angulation  was   then made and then the gold finger was then brought up, thereby completely   encircling the superior portion of the stomach posteriorly.  Next, the   REALIZE-C adjustable gastric band was placed within the abdomen through the   15 mm port.  The string was hooked around the gold finger and with removing   the gold finger detector device, the adjustable gastric band was placed   around the superior portion of the stomach.  It was placed in 180 degrees   and needed to be flipped, which was done.  Next, the end of the band was   placed through the buckle and snapped.  We then used traction on this   buckle going to the right side of the abdomen and caudally, thereby showing   [place] the aim point on the superior portion of the stomach for the suturing.   Three plication sutures were used using the EndoStitch with permanent   Surgidac suture.  Using EndoStitch in place, initial attempts with a free   needle proved to be too difficult and the suture was too short.  In any   event, the free needle was removed, the EndoStitch was brought into the   case and the plication was done uneventfully.  We made sure it was a nice   floppy loose wrap which was only partially across the band anteriorly.  It   did not extend to the right where the buckle lay.  Next, the end part of   the buckle which was excised as is the norm was removed.  The [facility]   liver retractor was removed, the patient was flattened and all trocars and   pneumoperitoneum was released.  In the next part of the case, a pocket was   created for placement of the port.  To do this, a 15 mm trocar site was   extended slightly at the skin and a Bovie cautery was used to free up the   fat, allowing exposure of the rectus fascia anteriorly and lateral to the   trocar site into the  abdomen.  The Ethicon port was then connected to the   tubing and excess tubing was replaced in the abdomen.  Finally, the port   was snapped into place with its applier.  Digital exam  showed it to be   firmly fixed at all four points.  The area was irrigated and no bleeding   was seen.  All excess loose globules of fat were removed and finally, the   skin incision at this point was closed with running 3-0 Vicryl suture.   Monocryl was used for all the small incisions.  Steri-Strips, gauze and   Tegaderm were used to dress the wounds.  The patient was awakened from   anesthesia, extubated and brought to the recovery room in excellent   condition with no known complications.  Blood loss was minimal.  She   received 2 liters of Ringer's Lactate.  The urine output was via the Foley   catheter which was removed at the end of the case.  There were no known   complications.  There were no specimens submitted.   Electronically Signed By:   Alanda Amass, M.D. 02/20/2009 12:43   ____________________________   Alanda Amass, M.D.   Seleta Rhymes  D:  02/19/2009  T:  02/20/2009  9:31 A   [id number]

## 2009-02-19 NOTE — Procedures (Signed)
Test Reason :   Blood Pressure : ***/*** mmHG   Vent. Rate : 064 BPM     Atrial Rate : 064 BPM   P-R Int : 174 ms          QRS Dur : 096 ms   QT Int : 466 ms       P-R-T Axes : 058 030 054 degrees   QTc Int : 480 ms   Normal sinus rhythm   Nonspecific T wave abnormality   Prolonged QT   Abnormal ECG   When compared with ECG of 01-Oct-2008 20:54,   No significant change was found   Confirmed by Rainey Pines, M.D., Partha ([id number]) on 02/19/2009 12:12:11 PM   Referred By:             Overread By: Lisabeth Devoid, M.D.

## 2009-02-19 NOTE — Procedures (Signed)
Test Reason :   Blood Pressure : ***/*** mmHG   Vent. Rate : 064 BPM     Atrial Rate : 064 BPM   P-R Int : 174 ms          QRS Dur : 096 ms   QT Int : 466 ms       P-R-T Axes : 058 030 054 degrees   QTc Int : 480 ms   Normal sinus rhythm   Nonspecific T wave abnormality   Prolonged QT   Abnormal ECG   When compared with ECG of 01-Oct-2008 20:54,   No significant change was found   Confirmed by Manchi, M.D., Partha ([id number]) on 02/19/2009 12:12:11 PM   Referred By:             Overread By: Partha Manchi, M.D.

## 2009-05-26 HISTORY — PX: LAPAROSCOPIC GASTRIC BANDING: SHX1100

## 2009-06-26 ENCOUNTER — Emergency Department (HOSPITAL_COMMUNITY): Admission: EM | Admit: 2009-06-26 | Discharge: 2009-06-26 | Payer: Self-pay | Admitting: Emergency Medicine
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
## 2009-12-01 ENCOUNTER — Ambulatory Visit: Payer: Self-pay | Admitting: Radiology

## 2009-12-01 ENCOUNTER — Emergency Department (HOSPITAL_BASED_OUTPATIENT_CLINIC_OR_DEPARTMENT_OTHER): Admission: EM | Admit: 2009-12-01 | Discharge: 2009-12-01 | Payer: Self-pay | Admitting: Emergency Medicine

## 2009-12-04 ENCOUNTER — Encounter: Payer: Self-pay | Admitting: Emergency Medicine

## 2009-12-04 ENCOUNTER — Inpatient Hospital Stay (HOSPITAL_COMMUNITY): Admission: EM | Admit: 2009-12-04 | Discharge: 2009-12-08 | Payer: Self-pay | Admitting: Internal Medicine

## 2009-12-04 ENCOUNTER — Ambulatory Visit: Payer: Self-pay | Admitting: Family

## 2009-12-04 ENCOUNTER — Ambulatory Visit: Payer: Self-pay | Admitting: Radiology

## 2009-12-04 DIAGNOSIS — I10 Essential (primary) hypertension: Secondary | ICD-10-CM | POA: Insufficient documentation

## 2009-12-07 ENCOUNTER — Ambulatory Visit: Payer: Self-pay | Admitting: Vascular Surgery

## 2009-12-07 ENCOUNTER — Encounter (INDEPENDENT_AMBULATORY_CARE_PROVIDER_SITE_OTHER): Payer: Self-pay | Admitting: Internal Medicine

## 2009-12-08 ENCOUNTER — Encounter: Payer: Self-pay | Admitting: Family

## 2009-12-22 ENCOUNTER — Telehealth: Payer: Self-pay | Admitting: Internal Medicine

## 2010-05-19 ENCOUNTER — Emergency Department (HOSPITAL_BASED_OUTPATIENT_CLINIC_OR_DEPARTMENT_OTHER): Admission: EM | Admit: 2010-05-19 | Discharge: 2010-05-19 | Payer: Self-pay | Admitting: Emergency Medicine

## 2010-05-19 ENCOUNTER — Ambulatory Visit: Payer: Self-pay | Admitting: Diagnostic Radiology

## 2010-05-19 ENCOUNTER — Encounter: Payer: Self-pay | Admitting: Family

## 2010-06-02 ENCOUNTER — Telehealth: Payer: Self-pay | Admitting: Family

## 2010-06-02 ENCOUNTER — Ambulatory Visit: Payer: Self-pay | Admitting: Family

## 2010-06-09 ENCOUNTER — Ambulatory Visit (HOSPITAL_BASED_OUTPATIENT_CLINIC_OR_DEPARTMENT_OTHER): Admission: RE | Admit: 2010-06-09 | Discharge: 2010-06-09 | Payer: Self-pay | Admitting: Internal Medicine

## 2010-06-09 ENCOUNTER — Ambulatory Visit: Payer: Self-pay | Admitting: Family

## 2010-06-09 ENCOUNTER — Ambulatory Visit: Payer: Self-pay | Admitting: Diagnostic Radiology

## 2010-06-09 LAB — CONVERTED CEMR LAB
AST: 15 units/L (ref 0–37)
Albumin: 4.6 g/dL (ref 3.5–5.2)
BUN: 11 mg/dL (ref 6–23)
Basophils Relative: 0 % (ref 0–1)
CO2: 24 meq/L (ref 19–32)
Chloride: 104 meq/L (ref 96–112)
Creatinine, Ser: 0.85 mg/dL (ref 0.40–1.20)
Eosinophils Relative: 2 % (ref 0–5)
Glucose, Bld: 93 mg/dL (ref 70–99)
Hemoglobin: 13.6 g/dL (ref 12.0–15.0)
LDL Cholesterol: 137 mg/dL — ABNORMAL HIGH (ref 0–99)
Lymphs Abs: 2.5 10*3/uL (ref 0.7–4.0)
Monocytes Relative: 7 % (ref 3–12)
Neutro Abs: 2 10*3/uL (ref 1.7–7.7)
Neutrophils Relative %: 40 % — ABNORMAL LOW (ref 43–77)
Platelets: 275 10*3/uL (ref 150–400)
Potassium: 4.1 meq/L (ref 3.5–5.3)
RDW: 13.2 % (ref 11.5–15.5)
Total Bilirubin: 0.5 mg/dL (ref 0.3–1.2)
Triglycerides: 230 mg/dL — ABNORMAL HIGH (ref <150)

## 2010-06-10 ENCOUNTER — Encounter (INDEPENDENT_AMBULATORY_CARE_PROVIDER_SITE_OTHER): Payer: Self-pay | Admitting: *Deleted

## 2010-06-10 ENCOUNTER — Telehealth: Payer: Self-pay | Admitting: Family

## 2010-06-21 ENCOUNTER — Telehealth: Payer: Self-pay | Admitting: Family

## 2010-06-26 ENCOUNTER — Emergency Department (HOSPITAL_BASED_OUTPATIENT_CLINIC_OR_DEPARTMENT_OTHER): Admission: EM | Admit: 2010-06-26 | Discharge: 2010-06-26 | Payer: Self-pay | Admitting: Emergency Medicine

## 2010-06-26 ENCOUNTER — Ambulatory Visit: Payer: Self-pay | Admitting: Radiology

## 2010-07-05 ENCOUNTER — Encounter: Payer: Self-pay | Admitting: Family

## 2010-07-06 ENCOUNTER — Ambulatory Visit: Payer: Self-pay | Admitting: Family

## 2010-07-06 DIAGNOSIS — E785 Hyperlipidemia, unspecified: Secondary | ICD-10-CM

## 2010-07-06 DIAGNOSIS — M545 Low back pain: Secondary | ICD-10-CM

## 2010-07-16 ENCOUNTER — Encounter: Payer: Self-pay | Admitting: Family

## 2010-09-05 ENCOUNTER — Emergency Department (HOSPITAL_BASED_OUTPATIENT_CLINIC_OR_DEPARTMENT_OTHER): Admission: EM | Admit: 2010-09-05 | Discharge: 2010-09-05 | Payer: Self-pay | Admitting: Emergency Medicine

## 2010-09-28 ENCOUNTER — Telehealth: Payer: Self-pay | Admitting: Family

## 2010-10-06 ENCOUNTER — Encounter (INDEPENDENT_AMBULATORY_CARE_PROVIDER_SITE_OTHER): Payer: Self-pay | Admitting: *Deleted

## 2010-10-18 ENCOUNTER — Encounter (INDEPENDENT_AMBULATORY_CARE_PROVIDER_SITE_OTHER): Payer: Self-pay | Admitting: *Deleted

## 2010-10-22 ENCOUNTER — Ambulatory Visit: Payer: Self-pay | Admitting: Family

## 2010-11-05 ENCOUNTER — Ambulatory Visit: Payer: Self-pay | Admitting: Family

## 2010-11-05 DIAGNOSIS — R635 Abnormal weight gain: Secondary | ICD-10-CM

## 2010-11-05 LAB — CONVERTED CEMR LAB
BUN: 16 mg/dL (ref 6–23)
Chloride: 100 meq/L (ref 96–112)
Glucose, Bld: 94 mg/dL (ref 70–99)
Sodium: 138 meq/L (ref 135–145)

## 2010-11-08 ENCOUNTER — Encounter: Payer: Self-pay | Admitting: Family

## 2010-11-20 ENCOUNTER — Inpatient Hospital Stay (HOSPITAL_COMMUNITY): Admission: EM | Admit: 2010-11-20 | Discharge: 2010-11-22 | Payer: Self-pay | Admitting: Internal Medicine

## 2010-11-20 ENCOUNTER — Encounter: Payer: Self-pay | Admitting: Emergency Medicine

## 2010-11-20 ENCOUNTER — Ambulatory Visit: Payer: Self-pay | Admitting: Diagnostic Radiology

## 2010-11-21 ENCOUNTER — Encounter (INDEPENDENT_AMBULATORY_CARE_PROVIDER_SITE_OTHER): Payer: Self-pay | Admitting: Internal Medicine

## 2010-11-23 ENCOUNTER — Telehealth: Payer: Self-pay | Admitting: Family

## 2010-11-25 ENCOUNTER — Ambulatory Visit: Payer: Self-pay | Admitting: Family

## 2010-11-25 DIAGNOSIS — R209 Unspecified disturbances of skin sensation: Secondary | ICD-10-CM | POA: Insufficient documentation

## 2010-11-26 ENCOUNTER — Telehealth: Payer: Self-pay | Admitting: Family

## 2010-12-17 ENCOUNTER — Encounter: Payer: Self-pay | Admitting: Family

## 2011-01-16 ENCOUNTER — Encounter: Payer: Self-pay | Admitting: Internal Medicine

## 2011-01-25 NOTE — Letter (Signed)
Summary: [place] No Show Letter  [place] at [facility]  [address]   [place], [place] [address]   Phone: [phone]  Fax: [phone]    10/18/2010 MRN: [id number]  [facility] [address] [place], [place]  [address]   Dear Ms. Sproles,   Our records indicate that you missed your scheduled appointment with Sandford Craze  on 10-18-2010.  Please contact this office to reschedule your appointment as soon as possible.  It is important that you keep your scheduled appointments with your physician, so we can provide you the best care possible.  Please be advised that there may be a charge for "no show" appointments.    Sincerely,   [place] at [facility]

## 2011-01-25 NOTE — Progress Notes (Signed)
  Phone Note Outgoing Call      

## 2011-01-25 NOTE — Letter (Signed)
Summary: Primary Care Consult Scheduled Letter  [place] at [facility]  [address]   [place], [place] [address]   Phone: [phone]  Fax: [phone]      06/10/2010 MRN: [id number]  Monica Phillips [address] [place], [place]  [address]    Dear [name],      We have scheduled an appointment for you.  At the recommendation of MELISSA O'SULLIVAN, FNP , we have scheduled you a consult with DR Richarda Overlie , PHYSICIAN FOR WOMEN  on JULY 5 ,2011 at 2:45 .  Their address is_[address] , [place] . The office phone number is [phone].  If this appointment day and time is not convenient for you, please feel free to call the office of the doctor you are being referred to at the number listed above and reschedule the appointment.  ARRIVE   JULY 5  @  2:15PM     It is important for you to keep your scheduled appointments. We are here to make sure you are given good patient care. If you have questions or you have made changes to your appointment, please notify us at  [phone], ask for HELEN.    Thank you,  Patient Care Coordinator [place] at [facility]

## 2011-01-25 NOTE — Letter (Signed)
Summary: [place] No Show Letter  [place] at [facility]  [address]   [place], [place] [address]   Phone: [phone]  Fax: [phone]    10/06/2010 MRN: [id number]  Monica Phillips [address] [place], [place]  [address]   Dear Ms. Monica Phillips,   Our records indicate that you missed your scheduled appointment with Sandford Craze  on 10-06-2010.  Please contact this office to reschedule your appointment as soon as possible.  It is important that you keep your scheduled appointments with your physician, so we can provide you the best care possible.  Please be advised that there may be a charge for "no show" appointments.    Sincerely,   [place] at [facility]

## 2011-01-25 NOTE — Assessment & Plan Note (Signed)
Summary: 1 MONTH FOLLOW UP/DK--Rm 4   Vital Signs:  Patient profile:   55 year old female Menstrual status:  regular Height:      61.5 inches Weight:      208 pounds BMI:     38.80 Temp:     97.7 degrees F oral Pulse rate:   72 / minute Pulse rhythm:   regular Resp:     12 per minute BP sitting:   140 / 82  (right arm) Cuff size:   large  Vitals Entered By: Mervin Kung CMA Duncan Dull) (July 06, 2010 8:31 AM) CC: Room 4  1 month follow up., Hypertension Management Is Patient Diabetic? No   CC:  Room 4  1 month follow up. and Hypertension Management.  History of Present Illness: Monica Phillips is a 55 year old female who presents today for follow up.    1) Hyperlipidemia-  not eating well, was intolerant to pravachol.  2) HTN-  Patient reports + med compliance, does not add salt to her food.  3) Back pain-  reports that she was seen in the ED on 7/2 for back pain.  Review of records notes that plain film of the lumbar spine revealed normal disc spaces and probable lower lumbar facet arthropathy. He back continues to bother her.   Hypertension History:      She denies chest pain, palpitations, dyspnea with exertion, and peripheral edema.  Further comments include: notes improvement in LE edema since starting HCTZ.        Positive major cardiovascular risk factors include hyperlipidemia and hypertension.  Negative major cardiovascular risk factors include female age less than 73 years old and non-tobacco-user status.     Allergies (verified): No Known Drug Allergies  Physical Exam  General:  Overweight AA female in NAD Head:  Normocephalic and atraumatic without obvious abnormalities. No apparent alopecia or balding. Lungs:  Normal respiratory effort, chest expands symmetrically. Lungs are clear to auscultation, no crackles or wheezes. Heart:  Normal rate and regular rhythm. S1 and S2 normal without gallop, murmur, click, rub or other extra sounds. Extremities:  trace left  pedal edema and trace right pedal edema.   Neurologic:  bilateral LE strength 5/5, normal gait   Impression & Recommendations:  Problem # 1:  HYPERLIPIDEMIA (ICD-272.4) Assessment Unchanged  Patient was intolerant to statin.  It is reasonable to work hard on diet, exercise and weight loss at this time.  Will check FLP in 3 months.  Labs Reviewed: SGOT: 15 (06/09/2010)   SGPT: <8 U/L (06/09/2010)  10 Yr Risk Heart Disease: 11 % Prior 10 Yr Risk Heart Disease: Not enough information (06/02/2010)   HDL:47 (06/09/2010)  LDL:137 (06/09/2010)  Chol:230 (06/09/2010)  Trig:230 (06/09/2010)  Problem # 2:  HYPERTENSION (ICD-401.9) Assessment: Improved BP is improved today, continue current meds. Her updated medication list for this problem includes:    Metoprolol Tartrate 50 Mg Tabs (Metoprolol tartrate) .Marland Kitchen..Marland Kitchen Two times a day by mouth    Triamterene-hctz 37.5-25 Mg Tabs (Triamterene-hctz) .Marland Kitchen... 1 tab by mouth daily  BP today: 140/82 Prior BP: 140/90 (06/09/2010)  10 Yr Risk Heart Disease: 11 % Prior 10 Yr Risk Heart Disease: Not enough information (06/02/2010)  Labs Reviewed: K+: 4.1 (06/09/2010) Creat: : 0.85 (06/09/2010)   Chol: 230 (06/09/2010)   HDL: 47 (06/09/2010)   LDL: 137 (06/09/2010)   TG: 230 (06/09/2010)  Problem # 3:  BACK PAIN, LUMBAR (ICD-724.2) Assessment: New  Will refer for PT, consider MRI if symtoms worsen  or do not improve with PT.  Orders: Physical Therapy Referral (PT)  Complete Medication List: 1)  Metoprolol Tartrate 50 Mg Tabs (Metoprolol tartrate) .... Two times a day by mouth 2)  Triamterene-hctz 37.5-25 Mg Tabs (Triamterene-hctz) .Marland Kitchen.. 1 tab by mouth daily  Hypertension Assessment/Plan:      The patient's hypertensive risk group is category B: At least one risk factor (excluding diabetes) with no target organ damage.  Her calculated 10 year risk of coronary heart disease is 11 %.  Today's blood pressure is 140/82.  Her blood pressure goal is <  140/90.  Patient Instructions: 1)  Work hard on a low cholesterol and low sodium diet. 2)  Try to exercise 20 minutes a day. 3)  Follow up in 3 months- please come fasting to this appointment.  4)  You will be contacted about your referral to physical therapy.    Current Allergies (reviewed today): No known allergies

## 2011-01-25 NOTE — Progress Notes (Signed)
Summary: needs hospital follow up   Phone Note Call from Patient Call back at [phone]   Caller: Patient Call For: Monica Phillips  Summary of Call: needs a hospital follow up with 2 to 4 days.  she says the earliest she could be here for the appt would be 400pm.  Please advise if I can schedule that late  Initial call taken by: Roselle Locus,  November 23, 2010 8:52 AM  Follow-up for Phone Call        4PM is ok.  Additional Follow-up for Phone Call Additional follow up Details #1::        scheduled the appt Thanks Roselle Locus  November 23, 2010 4:29 PM

## 2011-01-25 NOTE — Progress Notes (Signed)
Summary: Lab Results  Phone Note Outgoing Call   Summary of Call: Please call patient and let her know that her cholesterol is a little elevated.  I would like her to start pravastatin.  She should follow up in 1 month for lft's and flp- call us if she develops muscle pain on this medication. Initial call taken by: Lemont Fillers FNP,  June 10, 2010 1:41 PM  Follow-up for Phone Call        patient advised per [facility] instructions. She states she has a follow up with Efraim Kaufmann on July 12th @ 8:15 and will come fasting for that appointment for the blood work Follow-up by: Glendell Docker CMA,  June 10, 2010 4:37 PM    New/Updated Medications: PRAVACHOL 10 MG TABS (PRAVASTATIN SODIUM) one tablet by mouth daily at bedtime Prescriptions: PRAVACHOL 10 MG TABS (PRAVASTATIN SODIUM) one tablet by mouth daily at bedtime  #30 x 1   Entered and Authorized by:   Lemont Fillers FNP   Signed by:   Lemont Fillers FNP on 06/10/2010   Method used:   Electronically to        [facility] [phone]* (retail)       [address]       [place], [place]  [address]       Ph: [phone]       Fax: [phone]   RxID:   [id number]

## 2011-01-25 NOTE — Progress Notes (Signed)
Summary: reaction to meds  Phone Note Call from Patient Call back at [phone]   Reason for Call: Talk to Nurse Summary of Call: Pt states her cholestrol meds are making her feel weak, can she take something else Initial call taken by: Lannette Donath,  June 21, 2010 2:09 PM  Follow-up for Phone Call        Pt states she has noticed muscle weakness since taking Pravachol.  No energy.  Pt stated she has stopped medication.  Please advise.  Mervin Kung CMA  June 21, 2010 2:31 PM   Additional Follow-up for Phone Call Additional follow up Details #1::        She can hold until her follow up appointment in July.  We can discuss further at that time.     Additional Follow-up for Phone Call Additional follow up Details #2::    Pt advised of Tyreonna Czaplicki's instructions and voices understanding.  Mervin Kung CMA  June 21, 2010 4:46 PM

## 2011-01-25 NOTE — Letter (Signed)
Summary: [employer] for [facility]   Imported By: Maryln Gottron 08/03/2010 11:24:51  _____________________________________________________________________  External Attachment:    Type:   Image     Comment:   External Document

## 2011-01-25 NOTE — Assessment & Plan Note (Signed)
Summary: 3 mon follow up/hea   Vital Signs:  Patient profile:   55 year old female Menstrual status:  regular Height:      61.5 inches Weight:      217 pounds BMI:     40.48 O2 Sat:      97 % on Room air Temp:     98.2 degrees F oral Pulse rate:   68 / minute Resp:     18 per minute BP sitting:   130 / 80  (right arm) Cuff size:   large  Vitals Entered By: Glendell Docker CMA (November 05, 2010 11:00 AM)  O2 Flow:  Room air CC: 3 Month Follow up Is Patient Diabetic? No Pain Assessment Patient in pain? no      Comments refill on medication, no concerns   Primary Care Provider:  Lemont Fillers FNP  CC:  3 Month Follow up.  History of Present Illness: Patient presents today for follow up.  She has several concerns today  1)Weight loss-  went to [facility].  the patient tells me that she went to see a physician and to practice call diet try at this practice is not currently covered by her insurance and in fact will cost her $1600. She tells me that they focus on healthy eating followed by several days of liquid diet. the patient has gained 31 pounds in the last year. She tells me that she attributes this to her lack of exercise and poor eating habits. She often will skip breakfast and minimally a large meal overeating in the evening. She does have an exercise facility in her building however she often does not feel safe working out there alone she is motivated to start losing weight at this time.  2) hypertension-the patient notes positive med compliance she has also been watching her sodium she does note that she occasionally gets some puffiness in her ankles at the end of the day.  Preventive Screening-Counseling & Management  Alcohol-Tobacco     Smoking Status: never  Allergies (verified): No Known Drug Allergies  Past History:  Past Medical History: Last updated: 12/04/2009 Hypertension  Past Surgical History: Last updated: 06/09/2010 fibroid tumor  removal 1995 Lap band- June 2010  Review of Systems       see hpi  Physical Exam  General:  Well-developed,well-nourished,in no acute distress; alert,appropriate and cooperative throughout examination Neck:  No deformities, masses, or tenderness noted. Lungs:  Normal respiratory effort, chest expands symmetrically. Lungs are clear to auscultation, no crackles or wheezes. Heart:  Normal rate and regular rhythm. S1 and S2 normal without gallop, murmur, click, rub or other extra sounds. Extremities:  trace bilateral lower extremity edema   Impression & Recommendations:  Problem # 1:  WEIGHT GAIN (ICD-783.1) Assessment Deteriorated I did discuss with the patient trying to monitor her calories and limiting her calories to somewhere between 1200-1500 calories a day. I also instructed her to try to increase her physical activity and exercise 30 minutes a day we will make a referral to the [facility] weight loss clinic for further assistance. I also gave her information on a free calorie counter after that she can add to her phone so that she can better keep track of these calories. She did have a normal TSH performed in June. Orders: Misc. Referral (Misc. Ref)  Problem # 2:  HYPERTENSION (ICD-401.9) Assessment: Improved her blood pressure appears to be in better control today she is maintained on metoprolol and a diuretic we'll  check a B. met today and plan to have patient followup in 3 months. Her updated medication list for this problem includes:    Metoprolol Tartrate 50 Mg Tabs (Metoprolol tartrate) .Marland Kitchen..Marland Kitchen Two times a day by mouth    Triamterene-hctz 37.5-25 Mg Tabs (Triamterene-hctz) .Marland Kitchen... 1 tab by mouth daily  Orders: TLB-BMP (Basic Metabolic Panel-BMET) (80048-METABOL)  BP today: 130/80 Prior BP: 140/82 (07/06/2010)  Prior 10 Yr Risk Heart Disease: 11 % (07/06/2010)  Labs Reviewed: K+: 4.1 (06/09/2010) Creat: : 0.85 (06/09/2010)   Chol: 230 (06/09/2010)   HDL: 47 (06/09/2010)    LDL: 137 (06/09/2010)   TG: 230 (06/09/2010)  Complete Medication List: 1)  Metoprolol Tartrate 50 Mg Tabs (Metoprolol tartrate) .... Two times a day by mouth 2)  Triamterene-hctz 37.5-25 Mg Tabs (Triamterene-hctz) .Marland Kitchen.. 1 tab by mouth daily  Patient Instructions: 1)  Try to exercise 30 minutes a day. 2)  Try to keep your calories 1200-1500 calories a day.   3)  www.sparkpeople.com 4)  Follow up in 3 months.   Orders Added: 1)  TLB-BMP (Basic Metabolic Panel-BMET) [80048-METABOL] 2)  Misc. Referral [Misc. Ref] 3)  Est. Patient Level III [[address]]   Immunization History:  Influenza Immunization History:    Influenza:  declined (11/05/2010)   Contraindications/Deferment of Procedures/Staging:    Test/Procedure: FLU VAX    Reason for deferment: patient declined   Immunization History:  Influenza Immunization History:    Influenza:  Declined (11/05/2010)  Current Allergies (reviewed today): No known allergies     Preventive Care Screening  Pap Smear:    Date:  07/13/2010    Results:  normal

## 2011-01-25 NOTE — Progress Notes (Signed)
Summary: Metoprolol & Triamterene Refill  Phone Note Refill Request Call back at [phone] Message from:  Patient on September 28, 2010 4:55 PM  Refills Requested: Medication #1:  METOPROLOL TARTRATE 50 MG TABS two times a day by mouth   Dosage confirmed as above?Dosage Confirmed   Brand Name Necessary? No   Supply Requested: 1 month  Medication #2:  TRIAMTERENE-HCTZ 37.5-25 MG TABS 1 tab by mouth daily.   Dosage confirmed as above?Dosage Confirmed   Brand Name Necessary? No   Supply Requested: 1 month Pt took last dose today, needs enough til at least 10/06/10 appt   Method Requested: Electronic Next Appointment Scheduled: 10.12.11 Initial call taken by: Lannette Donath,  September 28, 2010 4:56 PM  Follow-up for Phone Call        Pt notified. Nicki Guadalajara Fergerson CMA Duncan Dull)  September 29, 2010 8:32 AM     Prescriptions: TRIAMTERENE-HCTZ 37.5-25 MG TABS (TRIAMTERENE-HCTZ) 1 tab by mouth daily  #30 x 0   Entered by:   Mervin Kung CMA (AAMA)   Authorized by:   Lemont Fillers FNP   Signed by:   Mervin Kung CMA (AAMA) on 09/29/2010   Method used:   [facility] to        [facility] [phone]* (retail)       [address]       [place], [place]  [address]       Ph: [phone]       Fax: [phone]   RxID:   [id number] METOPROLOL TARTRATE 50 MG TABS (METOPROLOL TARTRATE) two times a day by mouth  #60 x 0   Entered by:   Mervin Kung CMA (AAMA)   Authorized by:   Lemont Fillers FNP   Signed by:   Mervin Kung CMA (AAMA) on 09/29/2010   Method used:   Electronically to        [facility] [phone]* (retail)       [address]       [place], [place]  [address]       Ph: [phone]       Fax: [phone]   RxID:   [id number]

## 2011-01-25 NOTE — Assessment & Plan Note (Signed)
Summary: cpx fasting/dt--Rm 4   Vital Signs:  Patient profile:   55 year old female Menstrual status:  regular Height:      61.5 inches Weight:      205 pounds BMI:     38.24 Temp:     98.0 degrees F oral Pulse rate:   60 / minute Pulse rhythm:   regular Resp:     18 per minute BP sitting:   140 / 90  (right arm) Cuff size:   large  Vitals Entered By: Mervin Kung CMA (June 09, 2010 9:13 AM) CC: Room 4  Physical Is Patient Diabetic? No Comments Pharmacy never received Rx on 06/02/10. I will call them in today. Rx's [facility] on [facility] at [facility] at 9:50am.  Mervin Kung CMA  June 09, 2010 9:50 AM    CC:  Room 4  Physical.  History of Present Illness: Monica Phillips is  a 55 year old female who presents today for a complete physical exam.  Patient is not exercising regularly- plans to start walking and biking again. Diet is "terrible".   Patient is due for mammogram and Pap smear.  She reports that she is not taking her blood pressure medications due to issues with her pharmacy not receiving the prescription [facility] [address]  Preventive Screening-Counseling & Management  Alcohol-Tobacco     Alcohol drinks/day: 0     Smoking Status: never  Caffeine-Diet-Exercise     Caffeine use/day: <1 daily--tea     Does Patient Exercise: no  Allergies (verified): No Known Drug Allergies  Past History:  Past Surgical History: fibroid tumor removal 1995 Lap band- 07/04/2009 Family History: Mom- deceased at age 35 , massive CVA, HTN, DM2 Dad-deceased MI at 83, kidney failure, HTN, Cabg  3 brothers- Baldo Ash- DM, HTN, ?cancer lymphoma (living- age70) Karren Burly- living age 56- healthy Myron- living- 52, healthy 2 sisters- Karen Kitchens- living age 68- DM, HTN Diane- age 48- HTN, DM  Daughter- age 69- healthy Daughter age 60-healthy  Social History: Geneticist, 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 Resp: Denies cough CV:  Notes occasional chest pain before eating- "gas" reports substernal last few seconds not exacerbated by exertion GI:  Denies nausea or vomitting, or diarrhea- chronic constipation- uses miralax GU: Denies dysuria Lymphatic: Denies lymphadenopathy Musculoskeletal:  Denies muscle/joint pain Skin:  Denies Rashes or lesions Psychiatric: Denies depression or anxiety Neuro: Denies numbness or weakness     Physical Exam  General:  Overweight AA female in NAD Head:  Normocephalic and atraumatic without obvious abnormalities. No apparent alopecia or balding. Eyes:  PERRRLA Ears:  External ear exam shows no significant lesions or deformities.  Otoscopic examination reveals clear canals, tympanic membranes are intact bilaterally without bulging, retraction, inflammation or discharge. Hearing is grossly normal bilaterally. Mouth:  Oral mucosa and oropharynx without lesions or exudates.  Teeth in good repair. Neck:  thick neck, no masses or thyroid enlargment Chest Wall:  No deformities, masses, or tenderness noted. Breasts:  No mass, nodules, thickening, tenderness, bulging, retraction, inflamation, nipple discharge or skin changes noted.   Lungs:  Normal respiratory effort, chest expands symmetrically. Lungs are clear to auscultation, no crackles or wheezes. Heart:  Normal rate and regular rhythm.  S1 and S2 normal without gallop, murmur, click, rub or other extra sounds. Abdomen:  Bowel sounds positive,abdomen soft and non-tender without masses, organomegaly or hernias noted. Genitalia:  Pelvic Exam:        External: normal female genitalia without lesions or masses        Vagina: normal without lesions or masses        Cervix: normal without lesions or masses- unable to  visualize cervical OS due to cervical positioning        Adnexa: normal bimanual exam without masses or fullness        Uterus: normal by palpation        Pap smear: not performed Msk:  No deformity or scoliosis noted of thoracic or lumbar spine.   Extremities:  trace left pedal edema and trace right pedal edema.   Neurologic:  No cranial nerve deficits noted. Station and gait are normal. Plantar reflexes are down-going bilaterally. DTRs are symmetrical throughout. Sensory, motor and coordinative functions appear intact. Skin:  Intact without suspicious lesions or rashes Cervical Nodes:  No lymphadenopathy noted Axillary Nodes:  No palpable lymphadenopathy Psych:  Cognition and judgment appear intact. Alert and cooperative with normal attention span and concentration. No apparent delusions, illusions, hallucinations   Impression & Recommendations:  Problem # 1:  Preventive Health Care (ICD-V70.0) Immunizations reviewed and up to date.  Patient was counseled on diet, exercise and weight loss.  She reports that she had a negative stress test last year- records are currently unavailable to me.  EKG performed recently in the ED was reviewed- NSR rate 65, LV[facility] Orders: Mammogram (Screening) (Mammo) T-Comprehensive Metabolic Panel [id number]) T-CBC w/Diff ([id number]) T-TSH [id number]) T-Lipid Profile ([id number]) Gynecologic Referral (Gyn)  Complete Medication List: 1)  Metoprolol Tartrate 50 Mg Tabs (Metoprolol tartrate) .... Two times a day by mouth 2)  Triamterene-hctz 37.5-25 Mg Tabs (Triamterene-hctz) .Marland Kitchen.. 1 tab by mouth daily  Patient Instructions: 1)  It is important that you exercise regularly at least 20 minutes 5 times a week. If you develop chest pain, have severe difficulty breathing, or feel very tired , stop exercising immediately and seek medical attention. 2)  You need to lose weight. Consider a lower calorie diet and regular exercise.  3)  Resume your blood pressure  medications, Follow up in 1 month.    Current Allergies (reviewed today): No known allergies

## 2011-01-25 NOTE — Assessment & Plan Note (Signed)
Summary: HOSPITAL FOLLOW UP OK PER Monica Phillips/[facility]-rm 4-   Vital Signs:  Patient profile:   55 year old female Menstrual status:  regular Height:      61.5 inches Weight:      221.50 pounds BMI:     41.32 Temp:     98.1 degrees F oral Pulse rate:   66 / minute Pulse rhythm:   regular Resp:     18 per minute BP sitting:   130 / 78  (right arm) Cuff size:   large  Vitals Entered By: Mervin Kung CMA Duncan Dull) (November 25, 2010 4:03 PM) CC: Pt here for hospital follow up. Is Patient Diabetic? No Pain Assessment Patient in pain? no      Comments Pt agrees all med doses and directions are correct. Monica Phillips CMA Duncan Dull)  November 25, 2010 4:08 PM    Primary Care Monica Phillips:  Monica Fillers FNP  CC:  Pt here for hospital follow up.Marland Kitchen  History of Present Illness: Monica Phillips is a 55 year old female here for hospital follow up. She was hosptialized 11/27-11/28 for right sided weakness and chest pain.  She does tell me that this episode coincided with the sudden death of her 42 year old nephew from a brain aneuysm.  He apparently had a syncopal event at the Thanksgiving Dinner prior to his death.  She notes a great deal of stress surrounding this loss.    1) R  Sided weakness- While hospitalized, she underwent a negative echo, cardiac enzymes and head CT.  She reports that since she has been home she still has some R hand weakness and tingling-.  She reports that her right hand does often "fall asleep" at night.  Uses computer frequently.     2) Chest pain- none since discharge. One episode of SOB sitting in chair since returning home.  She had a negative chest x-ray at the hospital as well as negative serial cardiac enzymes.  3) Htn- Reports + compliance with her BP meds.  Allergies (verified): No Known Drug Allergies  Past History:  Past Medical History: Last updated: 12/04/2009 Hypertension  Past Surgical History: Last updated: 06/09/2010 fibroid tumor removal  1995 Lap band- June 2010  Review of Systems       Dependent edema,  denies dysuria.    Physical Exam  General:  Well-developed,well-nourished,in no acute distress; alert,appropriate and cooperative throughout examination Head:  Normocephalic and atraumatic without obvious abnormalities. No apparent alopecia or balding. Eyes:  PERRLA, sclera clear Neck:  No deformities, masses, or tenderness noted. No thyroid enlargement Lungs:  Normal respiratory effort, chest expands symmetrically. Lungs are clear to auscultation, no crackles or wheezes. Heart:  Normal rate and regular rhythm. S1 and S2 normal without gallop, murmur, click, rub or other extra sounds. Extremities:  trace left pedal edema and trace right pedal edema.   Neurologic:  alert & oriented X3 and cranial nerves II-XII intact.  RUE 4-5/5 strenth.  LUE 5/5 Bilateral LE 5/5 strength. diminished hand grasp right hand. Neg Phalans test.  Tinel test causes tingling in right index and middle finger. Psych:  Cognition and judgment appear intact. Alert and cooperative with normal attention span and concentration. No apparent delusions, illusions, hallucinations   Impression & Recommendations:  Problem # 1:  PARESTHESIA (ICD-782.0) Assessment New  55 year old patient with right arm paresthesia and weakness.  Need to rule out cervical pathology.  Will order CT of the C spine.  Pt had neg plain  film back in 2010.  Carpal tunnel is also a consideration.  Hospital records were reviewed.    Orders: Misc. Referral (Misc. Ref)  Problem # 2:  HYPERTENSION (ICD-401.9) Assessment: Unchanged Stable, continue current meds. Her updated medication list for this problem includes:    Metoprolol Tartrate 50 Mg Tabs (Metoprolol tartrate) .Marland Kitchen..Marland Kitchen Two times a day by mouth    Triamterene-hctz 37.5-25 Mg Tabs (Triamterene-hctz) .Marland Kitchen... 1 tab by mouth daily  BP today: 130/78 Prior BP: 130/80 (11/05/2010)  Prior 10 Yr Risk Heart Disease: 11 %  (07/06/2010)  Labs Reviewed: K+: 4.5 (11/05/2010) Creat: : 0.94 (11/05/2010)   Chol: 230 (06/09/2010)   HDL: 47 (06/09/2010)   LDL: 137 (06/09/2010)   TG: 230 (06/09/2010)  Complete Medication List: 1)  Metoprolol Tartrate 50 Mg Tabs (Metoprolol tartrate) .... Two times a day by mouth 2)  Triamterene-hctz 37.5-25 Mg Tabs (Triamterene-hctz) .Marland Kitchen.. 1 tab by mouth daily 3)  Aspirin Ec 81 Mg Tbec (Aspirin) .... One tablet by mouth once daily 4)  Cipro 500 Mg Tabs (Ciprofloxacin hcl) .... One tablet by mouth two times a day for 5 days 5)  Fish Oil 1000 Mg Caps (Omega-3 fatty acids) .... One cap by mouth once daily  Patient Instructions: 1)  You will be contacted about your CT scan of your neck. 2)  Please follow up in 1 month.   Orders Added: 1)  Misc. Referral [Misc. Ref] 2)  New Patient Level IV [[address]]    Current Allergies (reviewed today): No known allergies

## 2011-01-25 NOTE — Letter (Signed)
Summary: Encounter Notice/[facility]  Encounter Notice/[facility]   Imported By: Lanelle Bal 01/19/2010 08:57:23  _____________________________________________________________________  External Attachment:    Type:   Image     Comment:   External Document

## 2011-01-25 NOTE — Letter (Signed)
    at [facility] [address] [place], [place]  [address]  [place] Phone: [phone]      November 08, 2010   Monica Phillips [address] [place], [place] [address]  RE:  LAB RESULTS  Dear  Ms. Handlin,  The following is an interpretation of your most recent lab tests.  Please take note of any instructions provided or changes to medications that have resulted from your lab work.  ELECTROLYTES:  Good - no changes needed  KIDNEY FUNCTION TESTS:  Good - no changes needed     Sincerely Yours,    Lemont Fillers FNP

## 2011-01-25 NOTE — Assessment & Plan Note (Signed)
Summary: fu meds/dt--Rm 5   Vital Signs:  Patient profile:   55 year old female Menstrual status:  regular Height:      61.5 inches Weight:      207 pounds BMI:     38.62 Temp:     98.4 degrees F oral Pulse rate:   78 / minute Pulse rhythm:   regular Resp:     16 per minute BP sitting:   152 / 78  (right arm) Cuff size:   large  Vitals Entered By: Mervin Kung CMA (June 02, 2010 3:51 PM) CC: Room 5  Follow up.  Needs refills on meds., Hypertension Management Is Patient Diabetic? No   CC:  Room 5  Follow up.  Needs refills on meds. and Hypertension Management.  History of Present Illness: Monica Phillips is a 55 year old female who presents today for follow up of her blood pressure.  Notes that she has been taking metoprolol, but ran out of her triamterene/HCTZ.  She has not been compliant with low sodium diet.  Hypertension History:      She complains of headache and peripheral edema, but denies chest pain, palpitations, dyspnea with exertion, orthopnea, neurologic problems, syncope, and side effects from treatment.  Further comments include: notes + swelling in hands and feet since stopping diuretic.        Positive major cardiovascular risk factors include hypertension.  Negative major cardiovascular risk factors include female age less than 103 years old and non-tobacco-user status.     Allergies (verified): No Known Drug Allergies  Physical Exam  General:  Well-developed,well-nourished,in no acute distress; alert,appropriate and cooperative throughout examination Lungs:  Normal respiratory effort, chest expands symmetrically. Lungs are clear to auscultation, no crackles or wheezes. Heart:  Normal rate and regular rhythm. S1 and S2 normal without gallop, murmur, click, rub or other extra sounds. Extremities:  trace left pedal edema and trace right pedal edema.  Slight edema noted in bilateral fingers.   Impression & Recommendations:  Problem # 1:  HYPERTENSION  (ICD-401.9) Assessment Deteriorated Resume triamterene-HCTZ- this should also help with mild edema.  F/u next week for CPX- will check fasting labs at that time. Her updated medication list for this problem includes:    Metoprolol Tartrate 50 Mg Tabs (Metoprolol tartrate) .Marland Kitchen..Marland Kitchen Two times a day by mouth    Triamterene-hctz 37.5-25 Mg Tabs (Triamterene-hctz) .Marland Kitchen... 1 tab by mouth daily  BP today: 152/78 Prior BP: 136/84 (12/04/2009)  10 Yr Risk Heart Disease: Not enough information  Complete Medication List: 1)  Metoprolol Tartrate 50 Mg Tabs (Metoprolol tartrate) .... Two times a day by mouth 2)  Triamterene-hctz 37.5-25 Mg Tabs (Triamterene-hctz) .Marland Kitchen.. 1 tab by mouth daily  Hypertension Assessment/Plan:      The patient's hypertensive risk group is category A: No risk factors and no target organ damage.  Today's blood pressure is 152/78.  Her blood pressure goal is < 140/90.  Patient Instructions: 1)  Please follow up next week for  a complete physical- come fasting to this appointment. Prescriptions: TRIAMTERENE-HCTZ 37.5-25 MG TABS (TRIAMTERENE-HCTZ) 1 tab by mouth daily  #30 x 2   Entered and Authorized by:   Lemont Fillers FNP   Signed by:   Lemont Fillers FNP on 06/02/2010   Method used:   [facility]  [address] [phone][facility] (retail)       [address]       [place]  [place], [place]  [id number]       Ph: [phone] or [phone]       Fax: [phone]   RxID:   [id number] METOPROLOL TARTRATE 50 MG TABS (METOPROLOL TARTRATE) two times a day by mouth  #60 x 2   Entered and Authorized by:   Lemont Fillers FNP   Signed by:   Lemont Fillers FNP on 06/02/2010   Method used:   [facility] to        [facility] [phone]* (retail)       [address]       [place], [place]  [address]       Ph: [phone] or [phone]       Fax: [phone]   RxID:    [id number]   Current Allergies (reviewed today): No known allergies

## 2011-01-25 NOTE — Consult Note (Signed)
Summary: [employer] [facility]   Imported By: Maryln Gottron 08/03/2010 11:23:41  _____________________________________________________________________  External Attachment:    Type:   Image     Comment:   External Document

## 2011-01-25 NOTE — Progress Notes (Signed)
Summary: rx transfer  ---- Converted from flag ---- ---- 06/02/2010 4:20 PM, Lemont Fillers FNP wrote: could you please have them switch rx [facility] [address] [facility]?  Thanks ------------------------------  Spoke to [place] at [facility] and had rxs sent to [facility].  Mervin Kung CMA  June 02, 2010 4:42 PM

## 2011-01-27 NOTE — Medication Information (Signed)
Summary: Care Consideration Regarding Adding a Statin/[facility]  Care Consideration Regarding Adding a Statin/[facility]   Imported By: Lanelle Bal 01/11/2011 09:32:21  _____________________________________________________________________  External Attachment:    Type:   Image     Comment:   External Document

## 2011-03-08 LAB — CBC
HCT: 34.7 % — ABNORMAL LOW (ref 36.0–46.0)
HCT: 36 % (ref 36.0–46.0)
Hemoglobin: 12.2 g/dL (ref 12.0–15.0)
Hemoglobin: 12.5 g/dL (ref 12.0–15.0)
MCHC: 34.4 g/dL (ref 30.0–36.0)
MCHC: 35.2 g/dL (ref 30.0–36.0)
MCV: 94.9 fL (ref 78.0–100.0)
MCV: 95.9 fL (ref 78.0–100.0)
Platelets: 279 10*3/uL (ref 150–400)
RBC: 3.65 MIL/uL — ABNORMAL LOW (ref 3.87–5.11)
RBC: 3.76 MIL/uL — ABNORMAL LOW (ref 3.87–5.11)
RBC: 3.82 MIL/uL — ABNORMAL LOW (ref 3.87–5.11)
RDW: 12.3 % (ref 11.5–15.5)
RDW: 13.1 % (ref 11.5–15.5)
WBC: 6.1 10*3/uL (ref 4.0–10.5)
WBC: 6.5 10*3/uL (ref 4.0–10.5)
WBC: 6.8 10*3/uL (ref 4.0–10.5)

## 2011-03-08 LAB — BASIC METABOLIC PANEL
Chloride: 101 mEq/L (ref 96–112)
GFR calc Af Amer: >60 mL/min (ref >60)
GFR calc non Af Amer: 58 mL/min — ABNORMAL LOW (ref >60)
Potassium: 4.1 mEq/L (ref 3.5–5.1)
Sodium: 138 mEq/L (ref 135–145)

## 2011-03-08 LAB — COMPREHENSIVE METABOLIC PANEL
AST: 19 U/L (ref 0–37)
Calcium: 9.1 mg/dL (ref 8.4–10.5)
Chloride: 103 mEq/L (ref 96–112)
Chloride: 104 mEq/L (ref 96–112)
Creatinine, Ser: 0.8 mg/dL (ref 0.4–1.2)
GFR calc Af Amer: >60 mL/min (ref >60)
GFR calc non Af Amer: >60 mL/min (ref >60)
Potassium: 3.8 mEq/L (ref 3.5–5.1)
Potassium: 4 mEq/L (ref 3.5–5.1)
Sodium: 143 mEq/L (ref 135–145)
Total Bilirubin: 0.5 mg/dL (ref 0.3–1.2)
Total Protein: 7.2 g/dL (ref 6.0–8.3)

## 2011-03-08 LAB — DIFFERENTIAL
Basophils Relative: 1 % (ref 0–1)
Eosinophils Absolute: 0.1 10*3/uL (ref 0.0–0.7)
Eosinophils Relative: 2 % (ref 0–5)
Lymphocytes Relative: 43 % (ref 12–46)
Monocytes Absolute: 0.5 10*3/uL (ref 0.1–1.0)
Neutro Abs: 3 10*3/uL (ref 1.7–7.7)

## 2011-03-08 LAB — CARDIAC PANEL(CRET KIN+CKTOT+MB+TROPI)
CK, MB: 1.2 ng/mL (ref 0.3–4.0)
CK, MB: 1.3 ng/mL (ref 0.3–4.0)
Relative Index: 0.9 (ref 0.0–2.5)
Relative Index: 0.9 (ref 0.0–2.5)
Relative Index: 1 (ref 0.0–2.5)
Total CK: 128 U/L (ref 7–177)
Total CK: 129 U/L (ref 7–177)
Troponin I: 0.01 ng/mL (ref 0.00–0.06)
Troponin I: <0.01 ng/mL (ref 0.00–0.06)

## 2011-03-08 LAB — URINALYSIS, ROUTINE W REFLEX MICROSCOPIC
Glucose, UA: NEGATIVE mg/dL
Protein, ur: NEGATIVE mg/dL
Urobilinogen, UA: 1 mg/dL (ref 0.0–1.0)
pH: 7.5 (ref 5.0–8.0)

## 2011-03-08 LAB — PROTIME-INR: INR: 0.96 (ref 0.00–1.49)

## 2011-03-08 LAB — D-DIMER, QUANTITATIVE: D-Dimer, Quant: 0.49 ug/mL-FEU — ABNORMAL HIGH (ref 0.00–0.48)

## 2011-03-08 LAB — TSH: TSH: 2.972 u[IU]/mL (ref 0.350–4.500)

## 2011-03-08 LAB — POCT CARDIAC MARKERS: Troponin i, poc: <0.05 ng/mL (ref 0.00–0.09)

## 2011-03-08 LAB — APTT: aPTT: 27 seconds (ref 24–37)

## 2011-03-10 LAB — DIFFERENTIAL
Eosinophils Absolute: 0.1 10*3/uL (ref 0.0–0.7)
Eosinophils Relative: 2 % (ref 0–5)
Lymphocytes Relative: 44 % (ref 12–46)
Lymphs Abs: 2.9 10*3/uL (ref 0.7–4.0)
Monocytes Relative: 9 % (ref 3–12)
Neutrophils Relative %: 43 % (ref 43–77)

## 2011-03-10 LAB — CBC
Hemoglobin: 12.8 g/dL (ref 12.0–15.0)
MCH: 33.7 pg (ref 26.0–34.0)
MCV: 95.7 fL (ref 78.0–100.0)
Platelets: 274 10*3/uL (ref 150–400)
RBC: 3.8 MIL/uL — ABNORMAL LOW (ref 3.87–5.11)
WBC: 6.5 10*3/uL (ref 4.0–10.5)

## 2011-03-13 LAB — URINALYSIS, ROUTINE W REFLEX MICROSCOPIC
Glucose, UA: NEGATIVE mg/dL
Protein, ur: NEGATIVE mg/dL
pH: 7.5 (ref 5.0–8.0)

## 2011-03-13 LAB — URINE MICROSCOPIC-ADD ON

## 2011-03-25 ENCOUNTER — Emergency Department (INDEPENDENT_AMBULATORY_CARE_PROVIDER_SITE_OTHER): Payer: Worker's Compensation

## 2011-03-25 ENCOUNTER — Emergency Department (HOSPITAL_BASED_OUTPATIENT_CLINIC_OR_DEPARTMENT_OTHER)
Admission: EM | Admit: 2011-03-25 | Discharge: 2011-03-25 | Disposition: A | Discharge status: Home / Self Care | Payer: Worker's Compensation | Attending: Emergency Medicine | Admitting: Emergency Medicine

## 2011-03-25 DIAGNOSIS — X500XXA Overexertion from strenuous movement or load, initial encounter: Secondary | ICD-10-CM

## 2011-03-25 DIAGNOSIS — Y9289 Other specified places as the place of occurrence of the external cause: Secondary | ICD-10-CM | POA: Insufficient documentation

## 2011-03-25 DIAGNOSIS — M25579 Pain in unspecified ankle and joints of unspecified foot: Secondary | ICD-10-CM

## 2011-03-25 DIAGNOSIS — M7989 Other specified soft tissue disorders: Secondary | ICD-10-CM

## 2011-03-25 DIAGNOSIS — S93409A Sprain of unspecified ligament of unspecified ankle, initial encounter: Secondary | ICD-10-CM | POA: Insufficient documentation

## 2011-03-25 DIAGNOSIS — I10 Essential (primary) hypertension: Secondary | ICD-10-CM | POA: Insufficient documentation

## 2011-03-28 ENCOUNTER — Ambulatory Visit (INDEPENDENT_AMBULATORY_CARE_PROVIDER_SITE_OTHER): Payer: Worker's Compensation | Admitting: Family

## 2011-03-28 ENCOUNTER — Encounter: Payer: Self-pay | Admitting: Family

## 2011-03-28 VITALS — BP 156/84 | HR 66 | Temp 98.1°F | Resp 18 | Ht 61.5 in | Wt 229.0 lb

## 2011-03-28 DIAGNOSIS — J329 Chronic sinusitis, unspecified: Secondary | ICD-10-CM

## 2011-03-28 DIAGNOSIS — I10 Essential (primary) hypertension: Secondary | ICD-10-CM

## 2011-03-28 MED ORDER — AMLODIPINE BESYLATE 5 MG PO TABS: 5.0000 mg | ORAL_TABLET | Freq: Every day | ORAL | Status: DC

## 2011-03-28 MED ORDER — CEFUROXIME AXETIL 500 MG PO TABS: 500.0000 mg | ORAL_TABLET | Freq: Two times a day (BID) | ORAL | Status: AC

## 2011-03-28 MED ORDER — FLUTICASONE PROPIONATE 50 MCG/ACT NA SUSP: 2.0000 | Freq: Every day | NASAL | Status: DC

## 2011-03-28 NOTE — Patient Instructions (Signed)

## 2011-03-28 NOTE — Progress Notes (Signed)
  Subjective:    Patient ID: [name], female    DOB: August 12, 1956, 55 y.o.   MRN: [id number]  HPI  Ms.  Andreasen is a 55 year old female who presents today with complaint of nasal congestion x 2 weeks.  Has associated facial pain and left ear pain.  Denies fever.  Has tried mucinex and zyrtec without improvement.  Notes + sore throat.  +sneezing, + eyes watering.  She was treated with prednisone 1 week ago from urgent care.    Review of Systems See HPI    Objective:   Physical Exam  Constitutional: She appears well-developed and well-nourished.  HENT:  Head: Normocephalic and atraumatic.  Right Ear: External ear and ear canal normal.  Left Ear: External ear and ear canal normal.       + frontal sinus pressure to palpation. Bilateral TM's normal  Eyes: Conjunctivae are normal. Pupils are equal, round, and reactive to light.  Pulmonary/Chest: Effort normal and breath sounds normal.          Assessment & Plan:

## 2011-03-29 LAB — DIFFERENTIAL
Basophils Absolute: 0 10*3/uL (ref 0.0–0.1)
Lymphocytes Relative: 37 % (ref 12–46)
Lymphs Abs: 1.5 10*3/uL (ref 0.7–4.0)
Monocytes Absolute: 0.6 10*3/uL (ref 0.1–1.0)
Neutro Abs: 2 10*3/uL (ref 1.7–7.7)

## 2011-03-29 LAB — URINALYSIS, ROUTINE W REFLEX MICROSCOPIC
Bilirubin Urine: NEGATIVE
Glucose, UA: NEGATIVE mg/dL
Ketones, ur: NEGATIVE mg/dL
Specific Gravity, Urine: 1.018 (ref 1.005–1.030)
pH: 6.5 (ref 5.0–8.0)

## 2011-03-29 LAB — POCT CARDIAC MARKERS
CKMB, poc: <1 ng/mL — ABNORMAL LOW (ref 1.0–8.0)
Troponin i, poc: <0.05 ng/mL (ref 0.00–0.09)

## 2011-03-29 LAB — BASIC METABOLIC PANEL
BUN: 10 mg/dL (ref 6–23)
BUN: 7 mg/dL (ref 6–23)
CO2: 26 mEq/L (ref 19–32)
Calcium: 9.1 mg/dL (ref 8.4–10.5)
Chloride: 106 mEq/L (ref 96–112)
Creatinine, Ser: 0.8 mg/dL (ref 0.4–1.2)
Creatinine, Ser: 0.82 mg/dL (ref 0.4–1.2)
GFR calc Af Amer: >60 mL/min (ref >60)
GFR calc Af Amer: >60 mL/min (ref >60)
GFR calc non Af Amer: >60 mL/min (ref >60)
GFR calc non Af Amer: >60 mL/min (ref >60)
Glucose, Bld: 95 mg/dL (ref 70–99)
Potassium: 4 mEq/L (ref 3.5–5.1)
Potassium: 4.2 mEq/L (ref 3.5–5.1)
Potassium: 4 mEq/L (ref 3.5–5.1)
Sodium: 139 mEq/L (ref 135–145)

## 2011-03-29 LAB — TSH
TSH: 1.821 u[IU]/mL (ref 0.350–4.500)
TSH: 1.992 u[IU]/mL (ref 0.350–4.500)

## 2011-03-29 LAB — URINE MICROSCOPIC-ADD ON

## 2011-03-29 LAB — CBC
HCT: 33.5 % — ABNORMAL LOW (ref 36.0–46.0)
Hemoglobin: 12 g/dL (ref 12.0–15.0)
MCHC: 34.4 g/dL (ref 30.0–36.0)
MCV: 93.7 fL (ref 78.0–100.0)
Platelets: 282 10*3/uL (ref 150–400)
RDW: 12.5 % (ref 11.5–15.5)
WBC: 4.1 10*3/uL (ref 4.0–10.5)
WBC: 4.2 10*3/uL (ref 4.0–10.5)

## 2011-03-29 LAB — LIPID PANEL
HDL: 32 mg/dL — ABNORMAL LOW (ref >39)
Total CHOL/HDL Ratio: 4.1 RATIO

## 2011-03-29 LAB — C-REACTIVE PROTEIN: CRP: 0.3 mg/dL — ABNORMAL LOW (ref <0.6)

## 2011-03-29 LAB — HEMOGLOBIN A1C: Hgb A1c MFr Bld: 5.4 % (ref 4.6–6.1)

## 2011-03-29 LAB — T4, FREE: Free T4: 1.23 ng/dL (ref 0.80–1.80)

## 2011-03-30 NOTE — Assessment & Plan Note (Signed)
Plan treatment with cefuroxime.  Patient to call if symptoms worsen or do not improve.  She verbalizes understanding.

## 2011-05-16 ENCOUNTER — Other Ambulatory Visit: Payer: Self-pay | Admitting: Family

## 2011-05-18 ENCOUNTER — Observation Stay (HOSPITAL_COMMUNITY)
Admission: AD | Admit: 2011-05-18 | Discharge: 2011-05-19 | DRG: 125 | Disposition: A | Discharge status: Home / Self Care | Payer: BC Managed Care – PPO | Source: Other Acute Inpatient Hospital | Attending: Family Medicine | Admitting: Family Medicine

## 2011-05-18 ENCOUNTER — Emergency Department (HOSPITAL_BASED_OUTPATIENT_CLINIC_OR_DEPARTMENT_OTHER)
Admission: EM | Admit: 2011-05-18 | Discharge: 2011-05-18 | Disposition: A | Discharge status: Other Acute Inpatient Hospital | Payer: BC Managed Care – PPO | Source: Home / Self Care | Attending: Emergency Medicine | Admitting: Emergency Medicine

## 2011-05-18 ENCOUNTER — Encounter: Payer: Self-pay | Admitting: Family

## 2011-05-18 ENCOUNTER — Emergency Department (HOSPITAL_BASED_OUTPATIENT_CLINIC_OR_DEPARTMENT_OTHER): Payer: BC Managed Care – PPO

## 2011-05-18 ENCOUNTER — Emergency Department (INDEPENDENT_AMBULATORY_CARE_PROVIDER_SITE_OTHER): Payer: BC Managed Care – PPO

## 2011-05-18 ENCOUNTER — Ambulatory Visit (INDEPENDENT_AMBULATORY_CARE_PROVIDER_SITE_OTHER): Payer: BC Managed Care – PPO | Admitting: Family

## 2011-05-18 DIAGNOSIS — R079 Chest pain, unspecified: Secondary | ICD-10-CM

## 2011-05-18 DIAGNOSIS — I10 Essential (primary) hypertension: Secondary | ICD-10-CM

## 2011-05-18 DIAGNOSIS — R0602 Shortness of breath: Secondary | ICD-10-CM | POA: Insufficient documentation

## 2011-05-18 DIAGNOSIS — Z136 Encounter for screening for cardiovascular disorders: Secondary | ICD-10-CM

## 2011-05-18 DIAGNOSIS — Z01810 Encounter for preprocedural cardiovascular examination: Secondary | ICD-10-CM | POA: Insufficient documentation

## 2011-05-18 DIAGNOSIS — Z01812 Encounter for preprocedural laboratory examination: Secondary | ICD-10-CM | POA: Insufficient documentation

## 2011-05-18 DIAGNOSIS — Z01818 Encounter for other preprocedural examination: Secondary | ICD-10-CM | POA: Insufficient documentation

## 2011-05-18 DIAGNOSIS — I517 Cardiomegaly: Secondary | ICD-10-CM | POA: Insufficient documentation

## 2011-05-18 DIAGNOSIS — E785 Hyperlipidemia, unspecified: Secondary | ICD-10-CM | POA: Insufficient documentation

## 2011-05-18 DIAGNOSIS — R0789 Other chest pain: Principal | ICD-10-CM | POA: Insufficient documentation

## 2011-05-18 DIAGNOSIS — E669 Obesity, unspecified: Secondary | ICD-10-CM | POA: Insufficient documentation

## 2011-05-18 LAB — CBC
HCT: 38.5 % (ref 36.0–46.0)
MCHC: 35.8 g/dL (ref 30.0–36.0)
MCV: 88.7 fL (ref 78.0–100.0)
Platelets: 281 10*3/uL (ref 150–400)
RDW: 13.1 % (ref 11.5–15.5)

## 2011-05-18 LAB — BASIC METABOLIC PANEL
BUN: 9 mg/dL (ref 6–23)
Calcium: 9.9 mg/dL (ref 8.4–10.5)
Creatinine, Ser: 0.9 mg/dL (ref 0.4–1.2)
GFR calc non Af Amer: >60 mL/min (ref >60)
Glucose, Bld: 98 mg/dL (ref 70–99)

## 2011-05-18 LAB — DIFFERENTIAL
Eosinophils Absolute: 0.1 10*3/uL (ref 0.0–0.7)
Eosinophils Relative: 1 % (ref 0–5)
Lymphocytes Relative: 51 % — ABNORMAL HIGH (ref 12–46)
Lymphs Abs: 3.4 10*3/uL (ref 0.7–4.0)
Monocytes Absolute: 0.7 10*3/uL (ref 0.1–1.0)

## 2011-05-18 LAB — GLUCOSE, CAPILLARY: Glucose-Capillary: 118 mg/dL — ABNORMAL HIGH (ref 70–99)

## 2011-05-18 LAB — APTT: aPTT: 29 seconds (ref 24–37)

## 2011-05-18 LAB — PROTIME-INR
INR: 1.01 (ref 0.00–1.49)
Prothrombin Time: 13.5 seconds (ref 11.6–15.2)

## 2011-05-18 LAB — CK TOTAL AND CKMB (NOT AT ARMC): Total CK: 200 U/L — ABNORMAL HIGH (ref 7–177)

## 2011-05-18 LAB — D-DIMER, QUANTITATIVE: D-Dimer, Quant: 0.37 ug{FEU}/mL (ref 0.00–0.48)

## 2011-05-18 LAB — MRSA PCR SCREENING: MRSA by PCR: NEGATIVE

## 2011-05-18 MED ORDER — CLONIDINE HCL 0.1 MG PO TABS: 0.1000 mg | ORAL_TABLET | Freq: Two times a day (BID) | ORAL | Status: DC

## 2011-05-18 NOTE — Assessment & Plan Note (Signed)
Ran out of her meds 4 days ago.  She did not reveal this to me initially.  Therefore, will not add to her medicines at this time.  I have asked her to follow up in 2 weeks after she restarts her medications.

## 2011-05-18 NOTE — Assessment & Plan Note (Signed)
Note new TWI in leads V2 and V3. Pt will be referred to the ED for Rule out MI.

## 2011-05-18 NOTE — Progress Notes (Signed)
Subjective:    Patient ID: Monica Phillips, female    DOB: 10-09-1956, 55 y.o.   MRN: [id number]  HPI  [name] is a 55 yr old female who presents today for follow up of her HTN. She reports that she has been out of her medications x 4 days.   Chest pain- 2-3 day history of chest pain.  Substernal.  Not worse with exercise.  Worse with laying flat.  Denies associated nausea.  + associated jaw discomfort- more on the right side of her jaw.  Denies associated shortness of breath. Reports that her nephew who is 27 yrs old recently had an MI.   Review of Systems See HPI  Past Medical History  Diagnosis Date  . Hypertension     History   Social History  . Marital Status: Divorced    Spouse Name: N/A    Number of Children: 2  . Years of Education: N/A   Occupational History  . [employer]   Social History Main Topics  . Smoking status: Never Smoker   . Smokeless tobacco: Never Used  . Alcohol Use: No  . Drug Use: Not on file  . Sexually Active: Not on file   Other Topics Concern  . 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 Uterine fibroid surgery 1995  . Laparoscopic gastric banding 05/2009    Family History  Problem Relation Age of Onset  . Diabetes Mother     TYPE ii  . Hypertension Mother   . Stroke Mother   . Heart attack Father   . Kidney disease Father   . Hypertension Father   . Heart disease Father     CABG  . Diabetes Sister   . Hypertension Sister   . Diabetes Brother   . Hypertension Brother   . Cancer Brother     lymphoma  . Diabetes Sister   . Hypertension Sister     No Known Allergies  Current Outpatient Prescriptions on File Prior to Visit  Medication Sig Dispense Refill  . amLODipine (NORVASC) 5 MG tablet Take 1 tablet (5 mg total) by mouth daily.  30 tablet  2  . aspirin 81 MG EC tablet Take 81 mg by mouth daily.        .  fish oil-omega-3 fatty acids 1000 MG capsule Take 1 g by mouth daily.        . fluticasone (FLONASE) 50 MCG/ACT nasal spray 2 sprays by Nasal route daily.  16 g  2  . metoprolol (LOPRESSOR) 50 MG tablet TAKE 1 TABLET TWICE A DAY  60 tablet  0  . triamterene-hydrochlorothiazide (MAXZIDE-25) 37.5-25 MG per tablet TAKE 1 TABLET EVERY DAY  30 tablet  0    BP 146/90  Pulse 96  Temp(Src) 97.9 F (36.6 C) (Oral)  Resp 18  Ht 5' 1.5" (1.562 m)  Wt 227 lb (102.967 kg)  BMI 42.20 kg/m2       Objective:   Physical Exam  Constitutional: She appears well-developed and well-nourished.  HENT:  Head: Normocephalic and atraumatic.  Cardiovascular: Normal rate and regular rhythm.   Pulmonary/Chest: Effort normal and breath sounds normal.          Assessment & Plan:   BP Readings from Last 3 Encounters:  05/18/11 146/90  03/28/11 156/84  11/25/10 130/78   >30 minutes spent with this patient.  >  50% of this time was spent counseling patient on her HTN and chest pain/compliance.

## 2011-05-18 NOTE — Patient Instructions (Signed)
Please go directly to the ED

## 2011-05-19 DIAGNOSIS — R079 Chest pain, unspecified: Secondary | ICD-10-CM

## 2011-05-19 LAB — CARDIAC PANEL(CRET KIN+CKTOT+MB+TROPI)
Relative Index: 1.2 (ref 0.0–2.5)
Relative Index: 1.1 (ref 0.0–2.5)
Troponin I: <0.3 ng/mL (ref <0.30)

## 2011-05-19 LAB — COMPREHENSIVE METABOLIC PANEL
Albumin: 3.6 g/dL (ref 3.5–5.2)
Alkaline Phosphatase: 61 U/L (ref 39–117)
BUN: 11 mg/dL (ref 6–23)
CO2: 28 mEq/L (ref 19–32)
Chloride: 101 mEq/L (ref 96–112)
Creatinine, Ser: 0.8 mg/dL (ref 0.4–1.2)
GFR calc non Af Amer: >60 mL/min (ref >60)
Glucose, Bld: 103 mg/dL — ABNORMAL HIGH (ref 70–99)
Potassium: 4.2 mEq/L (ref 3.5–5.1)
Total Bilirubin: 0.3 mg/dL (ref 0.3–1.2)

## 2011-05-19 LAB — LIPID PANEL
Cholesterol: 208 mg/dL — ABNORMAL HIGH (ref 0–200)
LDL Cholesterol: 126 mg/dL — ABNORMAL HIGH (ref 0–99)
Triglycerides: 209 mg/dL — ABNORMAL HIGH (ref <150)
VLDL: 42 mg/dL — ABNORMAL HIGH (ref 0–40)

## 2011-05-19 LAB — RAPID URINE DRUG SCREEN, HOSP PERFORMED
Amphetamines: NOT DETECTED
Benzodiazepines: NOT DETECTED
Cocaine: NOT DETECTED
Tetrahydrocannabinol: NOT DETECTED

## 2011-05-19 LAB — TSH: TSH: 1.563 u[IU]/mL (ref 0.350–4.500)

## 2011-05-19 LAB — CBC
MCH: 31.7 pg (ref 26.0–34.0)
MCHC: 35.1 g/dL (ref 30.0–36.0)
MCV: 90.2 fL (ref 78.0–100.0)
Platelets: 232 10*3/uL (ref 150–400)
RBC: 3.98 MIL/uL (ref 3.87–5.11)
RDW: 13.2 % (ref 11.5–15.5)

## 2011-05-19 NOTE — H&P (Signed)
Monica Phillips, Monica Phillips            ACCOUNT NO.:  [id number]  MEDICAL RECORD NO.:  [id number]           PATIENT TYPE:  I  LOCATION:  2920                         FACILITY:  [facility]  PHYSICIAN:  Eduard Clos, MDDATE OF BIRTH:  08-11-1956  DATE OF ADMISSION:  05/18/2011 DATE OF DISCHARGE:                             HISTORY & PHYSICAL   PRIMARY CARE PHYSICIAN:  Barbette Hair. Artist Pais, DO  CHIEF COMPLAINT:  Chest pain.  HISTORY OF PRESENT ILLNESS:  A 55 year old female with history of hypertension, has been experiencing chest pain over the last 3 days, the chest pain is mostly in the retrosternal area, sometimes radiating to left side of her face, has no relation to exertion.  Has no diaphoresis, nausea, vomiting.  Has occasional shortness of breath.  Denies any fever, chills, cough or phlegm.  In the ER, the patient does found to have normal cardiac enzymes and D-dimer.  EKG was not showing nonspecific ST-T changes, has been admitted for chest pain rule out.  As the patient has been having persistent chest pain, the patient has been admitted to Step-down Unit.  The patient denies any dizziness, loss of consciousness, any focal deficit.  Denies any visual symptoms, difficulty speaking, swallowing, any abdominal pain, dysuria, discharge or diarrhea.  PAST MEDICAL HISTORY:  Hypertension.  PAST SURGICAL HISTORY:  Lap band surgery for obesity and uterine fibroid removal.  MEDICATIONS PRIOR TO ADMISSION: 1. Amlodipine 5 mg p.o. daily. 2. Aspirin 81 mg p.o. daily. 3. Metoprolol 50 mg twice daily. 4. Triamterene and hydrochlorothiazide.  All of these have to be     verified.  ALLERGIES:  NO KNOWN DRUG ALLERGIES.  FAMILY HISTORY:  Significant for coronary artery disease in her dad and stroke.  SOCIAL HISTORY:  The patient denies smoking cigarettes, drinking alcohol or using illegal drugs.  REVIEW OF SYSTEMS:  As per the history of present illness, nothing  else significant.  PHYSICAL EXAMINATION:  GENERAL:  The patient examined at bedside, not in acute distress. VITAL SIGNS:  Blood pressure is 130/70, pulse 74 per minute, temperature 98.4, respirations 18 per minute, O2 sat 99% on room air. HEENT:  Anicteric.  No pallor.  No discharge from ears, eyes, nose, or mouth. CHEST:  Bilateral air entry present.  No rhonchi.  No crepitation. HEART:  S1 and S2 heard. ABDOMEN:  Soft, nontender, bowel sounds heard. CNS:  The patient is alert, awake, and oriented to time, place and person.  Moves upper and lower extremities, 5/5. EXTREMITIES:  Peripheral pulses felt.  No edema.  LABORATORY DATA:  EKG shows normal sinus rhythm with some nonspecific ST- T changes, particularly in the leads V1, V2 and V3 with no reciprocal changes, heart rate is around 81 beats per minute, some of these changes are in the old EKG.  I am going to anyway discuss with Cardiology about the EKG findings.  The patient did have a 2-D echo in November 2011, which showed an EF of 60-65% with normal systolic function.  Chest x-ray shows cardiomegaly, mild pulmonary vascular prominence most notable centrally, please see above.  CBC, WBC 6.7, hemoglobin 13.8, hematocrit 38.5, platelets 281.  PT/INR 13.51.  D-dimer is 0.37.  Basic metabolic panel, sodium 139, potassium 4, chloride 99, carbon dioxide 27, glucose 98, BUN 9, creatinine 0.9, calcium 9.9, CK is 200, CK-MB is 2.5, troponin less than 0.3, BNP is 31.8.  ASSESSMENT: 1. Chest pain. 2. History of hypertension.  PLAN: 1. At this time, we will admit the patient to Step-down Unit as the     patient has persistent chest pain. 2. For chest pain, at this time, we will cycle cardiac markers.  We     will get a 2-D echo.  The patient will be on aspirin, nitroglycerin     paste.  We will continue her home medications after verification.     We repeat a 2-D echo and will also get an urine drug screen. 3. Further  recommendation based on test order and clinical course.     Eduard Clos, MD     ANK/MEDQ  D:  05/18/2011  T:  05/18/2011  Job:  [id number]  cc:   Barbette Hair. Artist Pais, DO  Electronically Signed by Midge Minium MD on 05/19/2011 06:32:01 AM

## 2011-05-25 ENCOUNTER — Encounter: Payer: Self-pay | Admitting: Family

## 2011-05-25 NOTE — Consult Note (Signed)
NAMEKEANU, FRICKEY            ACCOUNT NO.:  [id number]  MEDICAL RECORD NO.:  [id number]           PATIENT TYPE:  I  LOCATION:  2920                         FACILITY:  [facility]  PHYSICIAN:  Bevelyn Buckles. Bensimhon, MDDATE OF BIRTH:  1956/10/02  DATE OF CONSULTATION:  05/19/2011 DATE OF DISCHARGE:                                CONSULTATION   PRIMARY CARE PHYSICIAN:  Barbette Hair. Artist Pais, DO.  CONSULTING PHYSICIAN:  Hollice Espy, MD in the [facility].  REASON FOR CONSULTATION:  Chest pain.  HISTORY OF PRESENT ILLNESS:  Ms. Pedigo is a delightful 55 year old woman with a history of hypertension, newly diagnosed hyperlipidemia, and obesity status post previous lap band surgery.  She underwent cardiac catheterization in 2006 at [facility] for atypical chest pain.  This showed normal LV function with a minimal 20% plaque in her LAD.  Her coronaries were otherwise fine.  She has been doing fairly well until about 3 days ago when she started developing chest pressure.  This became a fairly constant.  She was out of her antihypertensive medications, so she went to her doctor when she related her symptoms, EKG was obtained and was different from her previous EKG.  So she was admitted to the hospital for further evaluation.  Should the chest pain lasted for 3 days, but she is now chest pain-free.  She did have some radiation up to her right jaw.  She denies significant exertional component.  There was no nausea, vomiting, or diaphoresis with the pain.  Her cardiac enzymes in the hospital have been normal.  REVIEW OF SYSTEMS:  She denies any fevers or chills.  She is in a nonsmoker.  She has not had any focal neurologic symptoms.  No heart failure symptoms.  The remainder of review of systems are negative except for HPI and the problem list.  PROBLEM LIST: 1. Hypertension. 2. Obesity status post lap band. 3. History of status post hysterectomy due to uterine  fibroids. 4. Recently diagnosed hyperlipidemia.  MEDICATIONS:  Prior to admission, which she was out of was, 1. Amlodipine 5 a day. 2. Aspirin 81 a day. 3. Metoprolol 50 b.i.d. 4. Maxzide.  ALLERGIES:  She has no known drug allergies.  FAMILY HISTORY:  Significant for coronary artery disease in her dad.  SOCIAL HISTORY:  She works as a Veterinary surgeon.  She denies any tobacco use or significant alcohol intake.  She does not use illicit drugs.  PHYSICAL EXAMINATION:  GENERAL:  She is a well-appearing, in no acute distress. VITAL SIGNS:  Blood pressure is 134/82, heart rate 76, she is satting 99% on room air.  Respirations are unlabored. HEENT:  Normal. NECK:  Supple and thick.  No JVD.  Carotids are 2+ bilaterally without bruits.  There is no lymphadenopathy or thyromegaly. CARDIAC:  PMI is normal.  She has regular rate and rhythm with 2/6 systolic ejection murmur at the left sternal border. LUNGS:  Clear. ABDOMEN:  Obese, nontender, and nondistended.  Good bowel sounds.  No hepatosplenomegaly appreciated. EXTREMITIES:  Warm.  There is no cyanosis or clubbing.  There is trace edema.  No rash. NEURO:  Alert  and oriented x3.  Cranial nerves II through XII are intact.  Moves all four extremities without difficulty.  Affect is pleasant.  LABORATORY DATA:  EKG shows normal sinus rhythm.  There is minimal ST- elevation anteriorly with biphasic T-waves from V1 through V6, these are new.  White count 6.7, hemoglobin 13.8, platelet 281.  Sodium 139, potassium 4.2, creatinine 0.80.  BNP is 32, total cholesterol is 2.8, triglycerides 209, HDL 40, LDL 126.  Cardiac markers were all normal.  D- dimer is normal.  Chest x-ray shows cardiomegaly and mild pulmonary vascular prominence.  No other acute process.  ASSESSMENT: 1. Chest pain with an abnormal EKG suggestive of unstable angina. 2. Obesity status post lap band. 3. Hypertension. 4. Tricuspid regurgitation murmur.  PLAN/DISCUSSION:   Her presentation and EKG are concerning to me.  We will plan a cath today to further evaluate.  Also check 2-D echocardiogram to evaluate her TR murmur further.  We appreciate the consult.  We will follow with you.     Bevelyn Buckles. Bensimhon, MD     DRB/MEDQ  D:  05/19/2011  T:  05/19/2011  Job:  [id number]  cc:   Barbette Hair. Artist Pais, DO Hollice Espy, M.D.  Electronically Signed by Arvilla Meres MD on 05/25/2011 10:43:35 PM

## 2011-05-27 ENCOUNTER — Encounter: Payer: Self-pay | Admitting: Family

## 2011-05-27 DIAGNOSIS — Z0289 Encounter for other administrative examinations: Secondary | ICD-10-CM

## 2011-05-27 NOTE — Discharge Summary (Signed)
Monica Phillips, HEYMANN            ACCOUNT NO.:  [id number]  MEDICAL RECORD NO.:  [id number]           PATIENT TYPE:  I  LOCATION:  2920                         FACILITY:  [facility]  PHYSICIAN:  Hollice Espy, M.D.DATE OF BIRTH:  [birth year]  DATE OF ADMISSION:  05/18/2011 DATE OF DISCHARGE:  05/19/2011                              DISCHARGE SUMMARY   PRIMARY CARE PHYSICIAN:  Sandford Craze, nurse practitioner for Dr. Thomos Lemons of [facility].  DISCHARGE DIAGNOSES: 1. Chest pain, noncardiac. 2. Hyperlipidemia, not on any medication secondary to side effects. 3. Hypertension. 4. Obesity.  DISCHARGE MEDICATIONS: 1. OxyIR 5 mg p.o. q.4 h. p.r.n. 2. Norvasc 5 p.o. daily. 3. Aspirin 81 p.o. daily. 4. Fish oil 1 p.o. daily. 5. Fluticasone nasal spray 1 spray each nostril daily as needed. 6. Metoprolol 50 p.o. b.i.d. 7. Triamterene/hydrochlorothiazide 37.5/25 p.o. daily.  DISCHARGE DIET:  Low sodium.  DISPOSITION:  Improved.  ACTIVITY:  The patient is cleared to return back to work after this weekend.  She will follow up with Dr. Sandford Craze, nurse practitioner next week.  HOSPITAL COURSE:  The patient is a 55 year old African American female with past medical history of hypertension who apparently has hyperlipidemia, but has not been able to tolerate statin secondary to muscle aches who presented with complaints of a 3-day continuous midsternal chest pain described as an ache.  She has also been out of her blood pressure medications for the past few days.  She went to go see her primary care provider and at that time she only complained of chest pain.  Peggyann Juba got an EKG which noted some questionable areas that were different from previous EKG.  The patient came to the emergency room for further evaluation.  She was admitted overnight by the Hospitalist Service.  Enzymes were negative.  Her other vital signs were stable.  Because of her risk  factors, possible EKG change, and symptoms that occurred after blood pressure medications were not taken, [facility] was consulted for evaluation.  Dr. Arvilla Meres examined the patient and was concerned about mild tricuspid regurg heard on exam and also concerning was anterior leads with some possible T- waves in V1-V2.  He went ahead and discussed with the patient.  They planned for cardiac catheterization.  The patient was taken to the Cardiac Cath Lab on afternoon of May 19, 2011.  Vessels were found to be clean.  EF was found to be at 65%.  She had a plaque in her LAD of less than 20%, otherwise found to be stable and doing well.  She was not found to have any significant CAD, normal left ventricular function and she was cleared for discharge.  Initial plan was by Dr. Gala Romney to get a 2-D echo to evaluate for tricuspid regurg based on her murmur. However, the echocardiogram was unable to be done, so the patient is going to have this set up as outpatient by [facility].  I have given the patient a prescription which Peggyann Juba needs to cut her by half tablet and schedule this echo.  Dr. Arvilla Meres will follow up on the echo results.  I  discussed with the patient her hyperlipidemia.  She says she has been tried on Crestor, Zocor, and Lipitor, but has not been able to tolerate because of some muscle aches.  Her LDL was noted to be 126, a total cholesterol of 208, and triglycerides 209.  Blood pressure was otherwise stable during this hospitalization.  She has been discharged to home.     Hollice Espy, M.D.     SKK/MEDQ  D:  05/19/2011  T:  05/20/2011  Job:  [id number]  cc:   Sandford Craze, NP  Electronically Signed by Virginia Rochester M.D. on 05/27/2011 11:14:45 AM

## 2011-06-01 NOTE — Cardiovascular Report (Signed)
  NAMEASPYN, [name]            ACCOUNT NO.:  [id number]  MEDICAL RECORD NO.:  [id number]           PATIENT TYPE:  I  LOCATION:  2920                         FACILITY:  [facility]  PHYSICIAN:  Peter M. Swaziland, M.D.  DATE OF BIRTH:  May 09, 1956  DATE OF PROCEDURE:  05/19/2011 DATE OF DISCHARGE:  05/19/2011                           CARDIAC CATHETERIZATION   INDICATIONS FOR PROCEDURE:  A 55 year old African American female with history of obesity and hypertension presents with symptoms of chest pain concerning for unstable angina.  EKG showed T-wave inversions in the anterolateral leads.  PROCEDURE:  Left heart catheterization, coronary and left ventricular angiography access via the right radial artery using standard Seldinger technique.  EQUIPMENT:  5-French 4 cm right Judkins catheter, 5-French 3.5-cm left Judkins catheter, 5-French pigtail catheter, 5-French arterial sheath.  MEDICATIONS:  Local anesthesia with 1% Xylocaine, Versed 1 mg IV, heparin 5000 units IV, and verapamil 3 mg intra-arterial contrast, 90 mL of Omnipaque.  HEMODYNAMIC DATA:  Aortic pressure is 152/80 with a mean of 108 mmHg. Left ventricular pressure is 159 with EDP of 23 mmHg.  ANGIOGRAPHIC DATA:  The right coronary arises slightly anteriorly.  It is normal throughout.  Left main coronary is normal.  Left anterior descending has mild diffuse narrowing in the mid vessel less than 20%.  Left circumflex coronary artery is a large codominant vessel.  It is normal.  Left ventricular angiography performed in the RAO view demonstrates normal left ventricular size and contractility with normal systolic function.  Ejection fraction is estimated at 65%.  FINAL INTERPRETATION: 1. No significant atherosclerotic coronary artery disease. 2. Normal left ventricular function.          ______________________________ Peter M. Swaziland, M.D.     PMJ/MEDQ  D:  05/19/2011  T:  05/20/2011  Job:  [id number]  cc:    Hollice Espy, M.D.  Electronically Signed by PETER Swaziland M.D. on 06/01/2011 06:04:53 PM

## 2011-06-08 ENCOUNTER — Encounter: Payer: Self-pay | Admitting: Family

## 2011-06-08 ENCOUNTER — Other Ambulatory Visit: Payer: Self-pay | Admitting: Family

## 2011-06-08 ENCOUNTER — Ambulatory Visit (INDEPENDENT_AMBULATORY_CARE_PROVIDER_SITE_OTHER): Payer: BC Managed Care – PPO | Admitting: Family

## 2011-06-08 VITALS — BP 130/84 | HR 84 | Temp 97.8°F | Resp 18 | Ht 61.5 in | Wt 228.0 lb

## 2011-06-08 DIAGNOSIS — Z1231 Encounter for screening mammogram for malignant neoplasm of breast: Secondary | ICD-10-CM

## 2011-06-08 DIAGNOSIS — R079 Chest pain, unspecified: Secondary | ICD-10-CM

## 2011-06-08 DIAGNOSIS — R011 Cardiac murmur, unspecified: Secondary | ICD-10-CM

## 2011-06-08 MED ORDER — METOPROLOL TARTRATE 50 MG PO TABS: 50.0000 mg | ORAL_TABLET | Freq: Two times a day (BID) | ORAL | Status: DC

## 2011-06-08 MED ORDER — TRIAMTERENE-HCTZ 37.5-25 MG PO TABS: 1.0000 | ORAL_TABLET | Freq: Every day | ORAL | Status: DC

## 2011-06-08 NOTE — Progress Notes (Signed)
Subjective:    Patient ID: Monica Phillips, female    DOB: [birth year], 55 y.o.   MRN: [id number]  HPI Monica Phillips is a 55 year old female who presents today for follow up.  She was admitted [facility] on May 23 for chest pain. She was discharged home on May 24. She underwent serial cardiac enzymes during this hospitalization which were negative, she also underwent a cardiac catheterization which noted  no significant atherosclerotic coronary artery disease, and normal left ventricular function. Since returning home she has had no further chest pain, or shortness of breath she also denies any lower extremity edema. Her workup was to include a 2-D echo which was not completed prior to her discharge. Review of Systems see history of present illness    Past Medical History  Diagnosis Date  . Hypertension     History   Social History  . Marital Status: Divorced    Spouse Name: N/A    Number of Children: 2  . Years of Education: N/A   Occupational History  . [employer]   Social History Main Topics  . Smoking status: Never Smoker   . Smokeless tobacco: Never Used  . Alcohol Use: No  . Drug Use: Not on file  . Sexually Active: Not on file   Other Topics Concern  . 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 Uterine fibroid surgery 1995  . Laparoscopic gastric banding 05/2009    Family History  Problem Relation Age of Onset  . Diabetes Mother     TYPE ii  . Hypertension Mother   . Stroke Mother   . Heart attack Father   . Kidney disease Father   . Hypertension Father   . Heart disease Father     CABG  . Diabetes Sister   . Hypertension Sister   . Diabetes Brother   . Hypertension Brother   . Cancer Brother     lymphoma  . Diabetes Sister   . Hypertension Sister     No Known Allergies  Current Outpatient Prescriptions on  File Prior to Visit  Medication Sig Dispense Refill  . aspirin 81 MG EC tablet Take 81 mg by mouth daily.        . fish oil-omega-3 fatty acids 1000 MG capsule Take 1 g by mouth daily.        Marland Kitchen DISCONTD: fluticasone (FLONASE) 50 MCG/ACT nasal spray 2 sprays by Nasal route daily.  16 g  2  . DISCONTD: metoprolol (LOPRESSOR) 50 MG tablet TAKE 1 TABLET TWICE A DAY  60 tablet  0  . DISCONTD: triamterene-hydrochlorothiazide (MAXZIDE-25) 37.5-25 MG per tablet TAKE 1 TABLET EVERY DAY  30 tablet  0  . DISCONTD: amLODipine (NORVASC) 5 MG tablet Take 1 tablet (5 mg total) by mouth daily.  30 tablet  2    BP 130/84  Pulse 84  Temp(Src) 97.8 F (36.6 C) (Oral)  Resp 18  Ht 5' 1.5" (1.562 m)  Wt 228 lb (103.42 kg)  BMI 42.38 kg/m2  SpO2 96%  LMP 12/26/2009    Objective:   Physical Exam  General: Awake, alert, and in no acute distress Cardiovascular: S1, S2, regular rate and rhythm Respiratory: Breath sounds are clear to auscultation bilaterally without wheezes rales or rhonchi Extremities:2+ bilateral lower extremity edema Psychiatric: Awake, alert, oriented x4, calm and pleasant  Assessment & Plan:

## 2011-06-08 NOTE — Patient Instructions (Signed)
Please follow up in 3 months. You will be contacted about your echocardiogram- let us know if

## 2011-06-08 NOTE — Assessment & Plan Note (Signed)
Cardiac workup was negative. She has had no further chest pain. Will refer for 2-D echo to complete her cardiac evaluation.

## 2011-06-15 ENCOUNTER — Ambulatory Visit (HOSPITAL_BASED_OUTPATIENT_CLINIC_OR_DEPARTMENT_OTHER)
Admission: RE | Admit: 2011-06-15 | Discharge: 2011-06-15 | Disposition: A | Discharge status: Home / Self Care | Payer: BC Managed Care – PPO | Source: Ambulatory Visit | Attending: Family | Admitting: Family

## 2011-06-15 DIAGNOSIS — Z1231 Encounter for screening mammogram for malignant neoplasm of breast: Secondary | ICD-10-CM | POA: Insufficient documentation

## 2011-09-28 ENCOUNTER — Encounter: Payer: Self-pay | Admitting: Family

## 2011-09-28 ENCOUNTER — Ambulatory Visit (INDEPENDENT_AMBULATORY_CARE_PROVIDER_SITE_OTHER): Payer: BC Managed Care – PPO | Admitting: Family

## 2011-09-28 DIAGNOSIS — L259 Unspecified contact dermatitis, unspecified cause: Secondary | ICD-10-CM

## 2011-09-28 MED ORDER — PREDNISONE 10 MG PO KIT: PACK | ORAL | Status: DC

## 2011-09-28 NOTE — Progress Notes (Signed)
Subjective:    Patient ID: Monica Phillips, female    DOB: [birth year], 56 y.o.   MRN: [id number]  HPI  54 yr old female who had hair dyed on Sunday.  Monday developed itching on her scalp and swollen glands. Tried benadryl OTC yesterday with minimal improvement.    Review of Systems See HPI  Past Medical History  Diagnosis Date  . Hypertension     History   Social History  . Marital Status: Divorced    Spouse Name: N/A    Number of Children: 2  . Years of Education: N/A   Occupational History  . [employer]   Social History Main Topics  . Smoking status: Never Smoker   . Smokeless tobacco: Never Used  . Alcohol Use: No  . Drug Use: Not on file  . Sexually Active: Not on file   Other Topics Concern  . 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 Uterine fibroid surgery 1995  . Laparoscopic gastric banding 05/2009    Family History  Problem Relation Age of Onset  . Diabetes Mother     TYPE ii  . Hypertension Mother   . Stroke Mother   . Heart attack Father   . Kidney disease Father   . Hypertension Father   . Heart disease Father     CABG  . Diabetes Sister   . Hypertension Sister   . Diabetes Brother   . Hypertension Brother   . Cancer Brother     lymphoma  . Diabetes Sister   . Hypertension Sister     No Known Allergies  Current Outpatient Prescriptions on File Prior to Visit  Medication Sig Dispense Refill  . aspirin 81 MG EC tablet Take 81 mg by mouth daily.        . fish oil-omega-3 fatty acids 1000 MG capsule Take 1 g by mouth daily.        . metoprolol (LOPRESSOR) 50 MG tablet Take 1 tablet (50 mg total) by mouth 2 (two) times daily.  60 tablet  3  . triamterene-hydrochlorothiazide (MAXZIDE-25) 37.5-25 MG per tablet Take 1 tablet by mouth daily.  30 tablet  3    BP 142/84  Pulse 102  Temp(Src) 98.2 F (36.8 C)  (Oral)  Resp 16  Ht 5' 1.5" (1.562 m)  Wt 233 lb (105.688 kg)  BMI 43.31 kg/m2  LMP 12/26/2009       Objective:   Physical Exam  Constitutional: She appears well-developed and well-nourished.  HENT:  Right Ear: Tympanic membrane and ear canal normal.  Left Ear: Tympanic membrane and ear canal normal.  Mouth/Throat: Uvula is midline, oropharynx is clear and moist and mucous membranes are normal.       Diffuse erythema noted of scalp.    No tongue or lip swelling.    Cardiovascular: Normal rate and regular rhythm.   No murmur heard. Pulmonary/Chest: Effort normal and breath sounds normal. No respiratory distress. She has no wheezes. She has no rales. She exhibits no tenderness.  Lymphadenopathy:    She has no cervical adenopathy.       Enlarged left pre-auricular node.    Skin: Skin is warm and dry.  Psychiatric: She has a normal mood and affect. Her behavior is normal. Judgment and thought content normal.          Assessment & Plan:

## 2011-09-28 NOTE — Assessment & Plan Note (Signed)
Will treat with prednisone dose pak.  Recommended Benadryl PRN.  She is instructed to contact us if she develops tongue/lip swelling, wheezing or if symptoms do not improve.

## 2011-09-28 NOTE — Patient Instructions (Signed)
Call if increased redness, itching, if symptoms worsen or if symptoms are not improved in 48-72 hours.

## 2011-09-29 ENCOUNTER — Telehealth: Payer: Self-pay | Admitting: *Deleted

## 2011-09-29 NOTE — Telephone Encounter (Signed)
Patient returned phone call. Best # [phone]

## 2011-09-29 NOTE — Telephone Encounter (Signed)
Left message on machine to return my call. 

## 2011-09-29 NOTE — Telephone Encounter (Signed)
Received message from pt stating she picked up Prednisone rx from drug store yesterday and there were no directions on her bottle. Please advise how pt should take med?

## 2011-09-29 NOTE — Telephone Encounter (Signed)
4 tabs once daily for 2 days, 3 tabs once daily for 2 days, 2 tabs once daily for 2 days, 1 tab for 2 days then stop. Pharmacy should have dispensed a dose pak that comes with the directions- let her know we apologize for the inconvenience.

## 2011-09-29 NOTE — Telephone Encounter (Signed)
Pt.notified

## 2011-12-02 ENCOUNTER — Encounter: Payer: Self-pay | Admitting: Family

## 2011-12-02 ENCOUNTER — Ambulatory Visit (INDEPENDENT_AMBULATORY_CARE_PROVIDER_SITE_OTHER): Payer: BC Managed Care – PPO | Admitting: Family

## 2011-12-02 ENCOUNTER — Telehealth: Payer: Self-pay | Admitting: Family

## 2011-12-02 VITALS — BP 160/96 | HR 102 | Temp 98.1°F | Resp 16 | Wt 227.0 lb

## 2011-12-02 DIAGNOSIS — M25469 Effusion, unspecified knee: Secondary | ICD-10-CM

## 2011-12-02 DIAGNOSIS — M25462 Effusion, left knee: Secondary | ICD-10-CM | POA: Insufficient documentation

## 2011-12-02 DIAGNOSIS — I10 Essential (primary) hypertension: Secondary | ICD-10-CM

## 2011-12-02 MED ORDER — AMLODIPINE BESYLATE 5 MG PO TABS: 5.0000 mg | ORAL_TABLET | Freq: Every day | ORAL | Status: DC

## 2011-12-02 MED ORDER — MELOXICAM 7.5 MG PO TABS: 7.5000 mg | ORAL_TABLET | Freq: Every day | ORAL | Status: DC

## 2011-12-02 NOTE — Progress Notes (Signed)
  Subjective:    Patient ID: Monica Phillips, female    DOB: 1956-07-07, 55 y.o.   MRN: [id number]  HPI    Review of Systems     Objective:   Physical Exam        Assessment & Plan:  Pt called CMA- very nervous.  Worried that she has bone cancer.  She would like x-ray.  I have placed order and advised pt to come to imaging at her earliest convenience to have completed.  She was also advised to follow up in 2 weeks.

## 2011-12-02 NOTE — Progress Notes (Signed)
  Subjective:    Patient ID: [name], female    DOB: [birth year], 55 y.o.   MRN: [id number]  HPI Subjective:   Ms.  Mikel is a 55 yr old female who presents with chief complaint of knee pain.  Pain started 4 days ago.  Denies known injury.  Woke up with swelling of the left knee.  She has tried biofreeze and a heating pad.    Objective:    BP 160/96  Pulse 102  Temp(Src) 98.1 F (36.7 C) (Oral)  Resp 16  Wt 227 lb 0.6 oz (102.985 kg)  LMP 12/26/2009     Assessment:   Plan:    Review of Systems     Objective:   Physical Exam  Constitutional: She is oriented to person, place, and time. She appears well-developed and well-nourished.  Musculoskeletal: She exhibits no edema.       L knee- small medial effusion without erythema or warmth.  Full rom of left knee  R knee normal.   Neurological: She is alert and oriented to person, place, and time.          Assessment & Plan:

## 2011-12-02 NOTE — Assessment & Plan Note (Addendum)
Trial of ice and mobic, if no improvement, will plan to refer to ortho.  Probable OA, we discussed the importance of weight loss.

## 2011-12-02 NOTE — Telephone Encounter (Signed)
Pls call pt and let her know that I would like to add amlodipine for her BP and have her follow up in 2 weeks.

## 2011-12-02 NOTE — Patient Instructions (Signed)
Apply ice twice daily to the left knee. Call if your pain/swelling is not improved in 2 weeks.

## 2011-12-02 NOTE — Telephone Encounter (Signed)
Left detailed message on cell phone re: new Rx and to call back and schedule f/u in 2 weeks.

## 2011-12-02 NOTE — Assessment & Plan Note (Signed)
Deteriorated.  Will plan to add amlodipine.

## 2011-12-02 NOTE — Progress Notes (Signed)
Addended by: Sandford Craze on: 12/02/2011 04:52 PM   Modules accepted: Orders

## 2011-12-05 ENCOUNTER — Ambulatory Visit (HOSPITAL_BASED_OUTPATIENT_CLINIC_OR_DEPARTMENT_OTHER)
Admission: RE | Admit: 2011-12-05 | Discharge: 2011-12-05 | Disposition: A | Discharge status: Home / Self Care | Payer: BC Managed Care – PPO | Source: Ambulatory Visit | Attending: Family | Admitting: Family

## 2011-12-05 DIAGNOSIS — M25469 Effusion, unspecified knee: Secondary | ICD-10-CM | POA: Insufficient documentation

## 2011-12-05 DIAGNOSIS — M25569 Pain in unspecified knee: Secondary | ICD-10-CM | POA: Insufficient documentation

## 2011-12-05 DIAGNOSIS — M25462 Effusion, left knee: Secondary | ICD-10-CM

## 2011-12-06 ENCOUNTER — Telehealth: Payer: Self-pay | Admitting: Family

## 2011-12-06 NOTE — Telephone Encounter (Signed)
Left message on machine re: x-ray results confirming arthritis of the knee.  Asked pt to let us know if her symptoms worsen or do not improve.  We can consider ortho referral if no improvement.  She is to call us with any questions/concerns.

## 2011-12-13 ENCOUNTER — Ambulatory Visit: Payer: Self-pay | Admitting: Family

## 2011-12-14 ENCOUNTER — Other Ambulatory Visit: Payer: Self-pay | Admitting: Family

## 2011-12-14 ENCOUNTER — Ambulatory Visit (HOSPITAL_BASED_OUTPATIENT_CLINIC_OR_DEPARTMENT_OTHER)
Admission: RE | Admit: 2011-12-14 | Discharge: 2011-12-14 | Disposition: A | Discharge status: Home / Self Care | Payer: BC Managed Care – PPO | Source: Ambulatory Visit | Attending: Family | Admitting: Family

## 2011-12-14 ENCOUNTER — Encounter: Payer: Self-pay | Admitting: Family

## 2011-12-14 ENCOUNTER — Telehealth: Payer: Self-pay | Admitting: Family

## 2011-12-14 ENCOUNTER — Ambulatory Visit (INDEPENDENT_AMBULATORY_CARE_PROVIDER_SITE_OTHER): Payer: BC Managed Care – PPO | Admitting: Family

## 2011-12-14 VITALS — BP 142/84 | HR 84 | Temp 98.0°F | Resp 18 | Wt 228.1 lb

## 2011-12-14 DIAGNOSIS — M7989 Other specified soft tissue disorders: Secondary | ICD-10-CM

## 2011-12-14 DIAGNOSIS — M79609 Pain in unspecified limb: Secondary | ICD-10-CM

## 2011-12-14 DIAGNOSIS — M1712 Unilateral primary osteoarthritis, left knee: Secondary | ICD-10-CM

## 2011-12-14 DIAGNOSIS — M1710 Unilateral primary osteoarthritis, unspecified knee: Secondary | ICD-10-CM

## 2011-12-14 NOTE — Telephone Encounter (Signed)
Please call pt on her cell and let her know that her ultrasound is negative for blood clot.

## 2011-12-14 NOTE — Telephone Encounter (Signed)
Pt.notified

## 2011-12-14 NOTE — Assessment & Plan Note (Signed)
LE doppler is negative for DVT.  Likely related to L knee swelling/OA.  No clinical sign of cellulitis.

## 2011-12-14 NOTE — Patient Instructions (Signed)
Please complete your ultrasound on the first floor today- we will contact you with the results.  Follow up in 1 year.

## 2011-12-14 NOTE — Assessment & Plan Note (Signed)
Unchanged despite trial of mobic. Will refer to Dr. Pearletha Forge for further evaluation.  X ray results are reviewed today with pt. We did discuss the importance of weight loss.

## 2011-12-14 NOTE — Progress Notes (Signed)
Subjective:    Patient ID: Monica Phillips, female    DOB: 1956/05/10, 55 y.o.   MRN: [id number]  HPI  Ms.  Raimondi is a 55 yr old female who presents today to discuss pain and swelling in the left leg.  She continues to have the knee pain, and reports little improvement despite use of the mobic.  She reports that she has some bruising on the front of her leg and some tenderness in the left calf.  She is concerned that she may have a blood clot.   Pt also tells me that she had a work up for some bone abnormalities before she moved.  She tells me that she had a "full body bone scan that was negative" a few years back.   Review of Systems See HPI  Past Medical History  Diagnosis Date  . Hypertension     History   Social History  . Marital Status: Divorced    Spouse Name: N/A    Number of Children: 2  . Years of Education: N/A   Occupational History  . [employer]   Social History Main Topics  . Smoking status: Never Smoker   . Smokeless tobacco: Never Used  . Alcohol Use: No  . Drug Use: Not on file  . Sexually Active: Not on file   Other Topics Concern  . 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 Uterine fibroid surgery 1995  . Laparoscopic gastric banding 05/2009    Family History  Problem Relation Age of Onset  . Diabetes Mother     TYPE ii  . Hypertension Mother   . Stroke Mother   . Heart attack Father   . Kidney disease Father   . Hypertension Father   . Heart disease Father     CABG  . Diabetes Sister   . Hypertension Sister   . Diabetes Brother   . Hypertension Brother   . Cancer Brother     lymphoma  . Diabetes Sister   . Hypertension Sister     No Known Allergies  Current Outpatient Prescriptions on File Prior to Visit  Medication Sig Dispense Refill  . amLODipine (NORVASC) 5 MG tablet Take 1 tablet (5 mg total)  by mouth daily.  30 tablet  0  . aspirin 81 MG EC tablet Take 81 mg by mouth daily.        . fish oil-omega-3 fatty acids 1000 MG capsule Take 1 g by mouth daily.        . meloxicam (MOBIC) 7.5 MG tablet Take 1 tablet (7.5 mg total) by mouth daily.  15 tablet  0  . metoprolol (LOPRESSOR) 50 MG tablet Take 1 tablet (50 mg total) by mouth 2 (two) times daily.  60 tablet  3  . triamterene-hydrochlorothiazide (MAXZIDE-25) 37.5-25 MG per tablet Take 1 tablet by mouth daily.  30 tablet  3    BP 142/84  Pulse 84  Temp(Src) 98 F (36.7 C) (Oral)  Resp 18  Wt 228 lb 1.9 oz (103.475 kg)  SpO2 97%  LMP 12/26/2009       Objective:   Physical Exam  Constitutional: She appears well-developed and well-nourished.  HENT:  Head: Normocephalic and atraumatic.  Musculoskeletal:       Mild bruising noted on the left shin.  Mild tenderness posterior calf with dorisflexion.    L  knee with mild edema.   Skin: Skin is warm and dry.  Psychiatric: She has a normal mood and affect. Her behavior is normal. Judgment and thought content normal.          Assessment & Plan:  Will request records re: bone scan that was performed a few years back. Pt signed release.

## 2011-12-15 ENCOUNTER — Telehealth: Payer: Self-pay | Admitting: Family

## 2011-12-15 MED ORDER — TRAMADOL HCL 50 MG PO TABS: 50.0000 mg | ORAL_TABLET | Freq: Three times a day (TID) | ORAL | Status: DC | PRN

## 2011-12-15 NOTE — Telephone Encounter (Signed)
Please advise 

## 2011-12-15 NOTE — Telephone Encounter (Signed)
Patient states that she is still having knee pain and would like something called in to [facility] on [address].

## 2011-12-15 NOTE — Telephone Encounter (Signed)
rx sent for tramadol

## 2011-12-19 ENCOUNTER — Telehealth: Payer: Self-pay | Admitting: Family

## 2011-12-19 NOTE — Telephone Encounter (Signed)
Received medical records from Dr. Loann Quill office

## 2011-12-28 ENCOUNTER — Ambulatory Visit (INDEPENDENT_AMBULATORY_CARE_PROVIDER_SITE_OTHER): Payer: BC Managed Care – PPO | Admitting: Family Medicine

## 2011-12-28 ENCOUNTER — Encounter: Payer: Self-pay | Admitting: Family Medicine

## 2011-12-28 VITALS — BP 147/84 | HR 63 | Temp 98.0°F | Ht 63.0 in | Wt 198.0 lb

## 2011-12-28 DIAGNOSIS — M25562 Pain in left knee: Secondary | ICD-10-CM

## 2011-12-28 DIAGNOSIS — M25569 Pain in unspecified knee: Secondary | ICD-10-CM

## 2011-12-28 MED ORDER — DICLOFENAC SODIUM 75 MG PO TBEC: 75.0000 mg | DELAYED_RELEASE_TABLET | Freq: Two times a day (BID) | ORAL | Status: DC

## 2011-12-28 MED ORDER — TRAMADOL HCL 50 MG PO TABS: 50.0000 mg | ORAL_TABLET | Freq: Three times a day (TID) | ORAL | Status: AC | PRN

## 2011-12-28 NOTE — Patient Instructions (Signed)
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 Avoid painful activities (especially squats and lunges, prolonged sitting, stairs) as much as possible Start physical therapy and do home exercises on days you do not go there - this is the most important part of your treatment. Icing 15 minutes at a time 3-4 times a day as needed for pain. Stop meloxicam (mobic) and try voltaren twice a day with food for 1 week then as needed. Continue the tramadol as needed for severe pain. The medicines should help some with the pain but they won't cure the condition. A cortisone injection is an option - would help for arthritis but tends not to help for patellofemoral syndrome. Consider surgery after 6 mos conservative tx if still having symptoms. Follow up with me in 1 month - if not improving will consider injection and/or MRI.

## 2011-12-29 ENCOUNTER — Encounter: Payer: Self-pay | Admitting: Family Medicine

## 2011-12-29 DIAGNOSIS — M25562 Pain in left knee: Secondary | ICD-10-CM | POA: Insufficient documentation

## 2011-12-29 NOTE — Progress Notes (Signed)
Subjective:    Patient ID: Monica Phillips, female    DOB: [birth year], 56 y.o.   MRN: [id number]  PCP: Alma Downs' Lendell Caprice  HPI 56 year old female here for her left knee pain.  Patient denies any known injury. She states that approximately 3 weeks ago she recalls waking up and when walking around she felt a popping behind her left kneecap along with pain. Pain see me worse with prolonged sitting, using stairs. She has no prior history of left knee problems. She states it feels like her kneecap is out of place. No history of gout. 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 She also had a Doppler ultrasound was negative for a DVT. She has been taking Mobic and tramadol with minimal improvement.  Past Medical History  Diagnosis Date  . Hypertension     Current Outpatient Prescriptions on File Prior to Visit  Medication Sig Dispense Refill  . amLODipine (NORVASC) 5 MG tablet Take 1 tablet (5 mg total) by mouth daily.  30 tablet  0  . aspirin 81 MG EC tablet Take 81 mg by mouth daily.        . fish oil-omega-3 fatty acids 1000 MG capsule Take 1 g by mouth daily.        . metoprolol (LOPRESSOR) 50 MG tablet Take 1 tablet (50 mg total) by mouth 2 (two) times daily.  60 tablet  3  . triamterene-hydrochlorothiazide (MAXZIDE-25) 37.5-25 MG per tablet Take 1 tablet by mouth daily.  30 tablet  3    Past Surgical History  Procedure Date  . Uterine fibroid surgery 1995  . Laparoscopic gastric banding 05/2009    No Known Allergies  History   Social History  . Marital Status: Divorced    Spouse Name: N/A    Number of Children: 2  . Years of Education: N/A   Occupational History  . [employer]   Social History Main Topics  . Smoking status: Never Smoker   . Smokeless tobacco: Never Used  . Alcohol Use: No  . Drug Use: Not on file  . Sexually Active: Not on file   Other Topics Concern  . 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 Diabetes Mother     TYPE ii  . Hypertension Mother   . Stroke Mother   . Hyperlipidemia Mother   . Heart attack Father   . Kidney disease Father   . Hypertension Father   . Heart disease Father     CABG  . Hyperlipidemia Father   . Diabetes Sister   . Hypertension Sister   . Diabetes Brother   . Hypertension Brother   . Cancer Brother     lymphoma  . Diabetes Sister   . Hypertension Sister     BP 147/84  Pulse 63  Temp(Src) 98 F (36.7 C) (Oral)  Ht 5\' 3"  (1.6 m)  Wt 198 lb (89.812 kg)  BMI 35.07 kg/m2  LMP 12/26/2009  Review of Systems See history of present illness above.    Objective:   Physical Exam General: No acute distress.  Left knee: Mild to moderate swelling but no warmth, bruising, erythema. No deformity otherwise. Mild to moderate palpation posterior patellar facets. No joint line, patellar tendon, posterior, or other tenderness to palpation. Range of motion 0-120. Knee stable to valgus and  varus stress. Negative anterior posterior drawer's and negative Lachman's. Negative Apley's and McMurray's. Negative patellar apprehension.  Right knee: Full range of motion without pain, swelling, or instability.    Assessment & Plan:  1. Left knee pain - patient had a Doppler ultrasound negative for DVT. She does have radiographs which show moderate medial DJD though she does not have pain in this region. Most of her tenderness is posterior to her patella though she only has minimal DJD here. 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 We'll start her in physical therapy and transition to a home exercise program. She can discontinue Mobic and start taking Voltaren twice a day with food for the next week then as needed. If not improving over the next month to 6 weeks would consider cortisone injection versus  MRI to further assess. See instructions for further.

## 2011-12-29 NOTE — Assessment & Plan Note (Signed)
patient had a Doppler ultrasound negative for DVT. She does have radiographs which show moderate medial DJD though she does not have pain in this region. Most of her tenderness is posterior to her patella though she only has minimal DJD here. 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 We'll start her in physical therapy and transition to a home exercise program. She can discontinue Mobic and start taking Voltaren twice a day with food for the next week then as needed. If not improving over the next month to 6 weeks would consider cortisone injection versus MRI to further assess. See instructions for further.

## 2012-01-05 ENCOUNTER — Telehealth: Payer: Self-pay | Admitting: *Deleted

## 2012-01-05 NOTE — Telephone Encounter (Signed)
Records received from [facility], Dr Margette Fast and forwarded to Provider for review.

## 2012-01-09 ENCOUNTER — Ambulatory Visit: Payer: BC Managed Care – PPO | Attending: Family Medicine | Admitting: Physical Therapy

## 2012-01-09 DIAGNOSIS — M25669 Stiffness of unspecified knee, not elsewhere classified: Secondary | ICD-10-CM | POA: Insufficient documentation

## 2012-01-09 DIAGNOSIS — IMO0001 Reserved for inherently not codable concepts without codable children: Secondary | ICD-10-CM | POA: Insufficient documentation

## 2012-01-09 DIAGNOSIS — M25569 Pain in unspecified knee: Secondary | ICD-10-CM | POA: Insufficient documentation

## 2012-01-16 ENCOUNTER — Ambulatory Visit: Payer: BC Managed Care – PPO | Admitting: Rehabilitation

## 2012-01-19 ENCOUNTER — Ambulatory Visit: Payer: BC Managed Care – PPO | Admitting: Rehabilitation

## 2012-01-23 ENCOUNTER — Ambulatory Visit: Payer: BC Managed Care – PPO | Admitting: Physical Therapy

## 2012-01-26 ENCOUNTER — Encounter: Payer: Self-pay | Admitting: Rehabilitation

## 2012-01-30 ENCOUNTER — Ambulatory Visit (INDEPENDENT_AMBULATORY_CARE_PROVIDER_SITE_OTHER): Payer: BC Managed Care – PPO | Admitting: Family Medicine

## 2012-01-30 ENCOUNTER — Ambulatory Visit: Payer: BC Managed Care – PPO | Admitting: Physical Therapy

## 2012-01-30 ENCOUNTER — Encounter: Payer: Self-pay | Admitting: Family Medicine

## 2012-01-30 VITALS — BP 145/91 | HR 69 | Temp 97.8°F | Ht 63.0 in | Wt 193.0 lb

## 2012-01-30 DIAGNOSIS — M25569 Pain in unspecified knee: Secondary | ICD-10-CM

## 2012-01-30 DIAGNOSIS — M25562 Pain in left knee: Secondary | ICD-10-CM

## 2012-01-30 NOTE — Patient Instructions (Signed)
You've done great! Continue home exercises for 2 more weeks every day. Then for 6 more weeks back down to doing exercises only 3 times a week. Medicines (tramadol, voltaren) only as needed now. You can go for one more PT visit only if you need a refresher for the exercise program. Follow up with me as needed

## 2012-01-31 ENCOUNTER — Encounter: Payer: Self-pay | Admitting: Family Medicine

## 2012-01-31 NOTE — Progress Notes (Signed)
Subjective:    Patient ID: Monica Phillips, female    DOB: 1956-04-13, 57 y.o.   MRN: [id number]  PCP: Alma Downs' Lendell Caprice  HPI  56 year old female here for f/u left knee pain.  1/2: Patient denies any known injury. She states that approximately 3 weeks ago she recalls waking up and when walking around she felt a popping behind her left kneecap along with pain. Pain see me worse with prolonged sitting, using stairs. She has no prior history of left knee problems. She states it feels like her kneecap is out of place. No history of gout. 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 She also had a Doppler ultrasound was negative for a DVT. She has been taking Mobic and tramadol with minimal improvement.  2/4: Patient has been doing PT as well as HEP. No longer has pain in left knee. No longer using tramadol or voltaren. Has no complaints. No catching, locking, instability.  Past Medical History  Diagnosis Date  . Hypertension     Current Outpatient Prescriptions on File Prior to Visit  Medication Sig Dispense Refill  . amLODipine (NORVASC) 5 MG tablet Take 1 tablet (5 mg total) by mouth daily.  30 tablet  0  . aspirin 81 MG EC tablet Take 81 mg by mouth daily.        . diclofenac (VOLTAREN) 75 MG EC tablet Take 1 tablet (75 mg total) by mouth 2 (two) times daily with a meal.  60 tablet  2  . fish oil-omega-3 fatty acids 1000 MG capsule Take 1 g by mouth daily.        . metoprolol (LOPRESSOR) 50 MG tablet Take 1 tablet (50 mg total) by mouth 2 (two) times daily.  60 tablet  3  . triamterene-hydrochlorothiazide (MAXZIDE-25) 37.5-25 MG per tablet Take 1 tablet by mouth daily.  30 tablet  3    Past Surgical History  Procedure Date  . Uterine fibroid surgery 1995  . Laparoscopic gastric banding 05/2009    No Known Allergies  History   Social History  . Marital Status: Divorced    Spouse Name: N/A    Number of  Children: 2  . Years of Education: N/A   Occupational History  . [employer]   Social History Main Topics  . Smoking status: Never Smoker   . Smokeless tobacco: Never Used  . Alcohol Use: No  . Drug Use: Not on file  . Sexually Active: Not on file   Other Topics Concern  . 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 Diabetes Mother     TYPE ii  . Hypertension Mother   . Stroke Mother   . Hyperlipidemia Mother   . Heart attack Father   . Kidney disease Father   . Hypertension Father   . Heart disease Father     CABG  . Hyperlipidemia Father   . Diabetes Sister   . Hypertension Sister   . Diabetes Brother   . Hypertension Brother   . Cancer Brother     lymphoma  . Diabetes Sister   . Hypertension Sister     BP 145/91  Pulse 69  Temp(Src) 97.8 F (36.6 C) (Oral)  Ht 5\' 3"  (1.6 m)  Wt 193 lb (87.544 kg)  BMI 34.19 kg/m2  LMP 12/26/2009  Review of  Systems  See history of present illness above.    Objective:   Physical Exam  General: No acute distress.  Left knee: No gross deformity, ecchymoses, swelling.   No TTP. FROM. Negative ant/post drawers. Negative valgus/varus testing. Negative lachmanns. Negative mcmurrays, apleys, patellar apprehension, clarkes. NV intact distally.  Right knee: Full range of motion without pain, swelling, or instability.    Assessment & Plan:  1. Left knee pain - 2/2 patellofemoral syndrome.  Significantly improved.  To continue with HEP for total of 6 weeks then decrease frequency of HEP to 3x/week.  Voltaren and tramadol only prn at this point.  Icing prn.  F/u prn.

## 2012-01-31 NOTE — Assessment & Plan Note (Signed)
2/2 patellofemoral syndrome.  Significantly improved.  To continue with HEP for total of 6 weeks then decrease frequency of HEP to 3x/week.  Voltaren and tramadol only prn at this point.  Icing prn.  F/u prn.

## 2012-02-02 ENCOUNTER — Ambulatory Visit: Payer: BC Managed Care – PPO | Attending: Family Medicine | Admitting: Rehabilitation

## 2012-02-02 DIAGNOSIS — M25669 Stiffness of unspecified knee, not elsewhere classified: Secondary | ICD-10-CM | POA: Insufficient documentation

## 2012-02-02 DIAGNOSIS — M25569 Pain in unspecified knee: Secondary | ICD-10-CM | POA: Insufficient documentation

## 2012-02-02 DIAGNOSIS — IMO0001 Reserved for inherently not codable concepts without codable children: Secondary | ICD-10-CM | POA: Insufficient documentation

## 2012-02-06 ENCOUNTER — Ambulatory Visit: Payer: BC Managed Care – PPO | Admitting: Physical Therapy

## 2012-02-09 ENCOUNTER — Ambulatory Visit: Payer: BC Managed Care – PPO | Admitting: Rehabilitation

## 2012-03-05 ENCOUNTER — Encounter (HOSPITAL_BASED_OUTPATIENT_CLINIC_OR_DEPARTMENT_OTHER): Payer: Self-pay

## 2012-03-05 ENCOUNTER — Emergency Department (HOSPITAL_BASED_OUTPATIENT_CLINIC_OR_DEPARTMENT_OTHER)
Admission: EM | Admit: 2012-03-05 | Discharge: 2012-03-05 | Disposition: A | Discharge status: Home / Self Care | Payer: BC Managed Care – PPO | Attending: Emergency Medicine | Admitting: Emergency Medicine

## 2012-03-05 ENCOUNTER — Other Ambulatory Visit: Payer: Self-pay

## 2012-03-05 ENCOUNTER — Emergency Department (INDEPENDENT_AMBULATORY_CARE_PROVIDER_SITE_OTHER): Payer: BC Managed Care – PPO

## 2012-03-05 ENCOUNTER — Ambulatory Visit: Payer: Self-pay | Admitting: Family

## 2012-03-05 DIAGNOSIS — R51 Headache: Secondary | ICD-10-CM

## 2012-03-05 DIAGNOSIS — R29898 Other symptoms and signs involving the musculoskeletal system: Secondary | ICD-10-CM | POA: Insufficient documentation

## 2012-03-05 DIAGNOSIS — I10 Essential (primary) hypertension: Secondary | ICD-10-CM | POA: Insufficient documentation

## 2012-03-05 DIAGNOSIS — R5381 Other malaise: Secondary | ICD-10-CM | POA: Insufficient documentation

## 2012-03-05 LAB — BASIC METABOLIC PANEL
BUN: 11 mg/dL (ref 6–23)
CO2: 28 mEq/L (ref 19–32)
Calcium: 10.5 mg/dL (ref 8.4–10.5)
Chloride: 98 mEq/L (ref 96–112)
Creatinine, Ser: 0.9 mg/dL (ref 0.50–1.10)
Glucose, Bld: 114 mg/dL — ABNORMAL HIGH (ref 70–99)

## 2012-03-05 LAB — DIFFERENTIAL
Basophils Absolute: 0 10*3/uL (ref 0.0–0.1)
Eosinophils Relative: 3 % (ref 0–5)
Lymphocytes Relative: 53 % — ABNORMAL HIGH (ref 12–46)
Lymphs Abs: 3.4 10*3/uL (ref 0.7–4.0)
Monocytes Absolute: 0.6 10*3/uL (ref 0.1–1.0)
Monocytes Relative: 9 % (ref 3–12)
Neutro Abs: 2.3 10*3/uL (ref 1.7–7.7)

## 2012-03-05 LAB — CBC
HCT: 40.8 % (ref 36.0–46.0)
Hemoglobin: 14.6 g/dL (ref 12.0–15.0)
MCV: 89.1 fL (ref 78.0–100.0)
RBC: 4.58 MIL/uL (ref 3.87–5.11)
RDW: 13.4 % (ref 11.5–15.5)
WBC: 6.5 10*3/uL (ref 4.0–10.5)

## 2012-03-05 LAB — TROPONIN I: Troponin I: <0.3 ng/mL (ref <0.30)

## 2012-03-05 MED ORDER — SODIUM CHLORIDE 0.9 % IV SOLN
Freq: Once | INTRAVENOUS | Status: AC
  Administered 2012-03-05: 100 mL/h via INTRAVENOUS

## 2012-03-05 MED ORDER — MORPHINE SULFATE 2 MG/ML IJ SOLN
2.0000 mg | Freq: Once | INTRAMUSCULAR | Status: DC
  Filled 2012-03-05: qty 1

## 2012-03-05 MED ORDER — ONDANSETRON HCL 4 MG/2ML IJ SOLN
4.0000 mg | Freq: Once | INTRAMUSCULAR | Status: AC
  Administered 2012-03-05: 4 mg via INTRAVENOUS
  Filled 2012-03-05: qty 2

## 2012-03-05 NOTE — Discharge Instructions (Signed)
Headache Headaches are caused by many different problems. Most commonly, headache is caused by muscle tension from an injury, fatigue, or emotional upset. Excessive muscle contractions in the scalp and neck result in a headache that often feels like a tight band around the head. Tension headaches often have areas of tenderness over the scalp and the back of the neck. These headaches may last for hours, days, or longer, and some may contribute to migraines in those who have migraine problems. Migraines usually cause a throbbing headache, which is made worse by activity. Sometimes only one side of the head hurts. Nausea, vomiting, eye pain, and avoidance of food are common with migraines. Visual symptoms such as light sensitivity, blind spots, or flashing lights may also occur. Loud noises may worsen migraine headaches. Many factors may cause migraine headaches:  Emotional stress, lack of sleep, and menstrual periods.   Alcohol and some drugs (such as birth control pills).   Diet factors (fasting, caffeine, food preservatives, chocolate).   Environmental factors (weather changes, bright lights, odors, smoke).  Other causes of headaches include minor injuries to the head. Arthritis in the neck; problems with the jaw, eyes, ears, or nose are also causes of headaches. Allergies, drugs, alcohol, and exposure to smoke can also cause moderate headaches. Rebound headaches can occur if someone uses pain medications for a long period of time and then stops. Less commonly, blood vessel problems in the neck and brain (including stroke) can cause various types of headache. Treatment of headaches includes medicines for pain and relaxation. Ice packs or heat applied to the back of the head and neck help some people. Massaging the shoulders, neck and scalp are often very useful. Relaxation techniques and stretching can help prevent these headaches. Avoid alcohol and cigarette smoking as these tend to make headaches  worse. Please see your caregiver if your headache is not better in 2 days.  SEEK IMMEDIATE MEDICAL CARE IF:   You develop a high fever, chills, or repeated vomiting.   You faint or have difficulty with vision.   You develop unusual numbness or weakness of your arms or legs.   Relief of pain is inadequate with medication, or you develop severe pain.   You develop confusion, or neck stiffness.   You have a worsening of a headache or do not obtain relief.  Document Released: 12/12/2005 Document Revised: 12/01/2011 Document Reviewed: 06/07/2007 [employer] Patient Information 2012 [employer]. 

## 2012-03-05 NOTE — ED Provider Notes (Signed)
Medical screening examination/treatment/procedure(s) were conducted as a shared visit with non-physician practitioner(s) and myself.  I personally evaluated the patient during the encounter  Patient followed here at [facility] by her primary care physician. Patient with headache for 4 days not severe but persistent and then just recently started with subtle weakness to right arm that started this morning. Patient has had similar type symptoms in the past but has never had an MRI based on today's head CT and laboratory findings and vital signs okay to go home but MRI is needed for additional workup patient will contact her primary care doctor to arrange this. Patient will return for any new or worse symptoms. Head CT is out for head bleed of doubt 16 make stroke symptoms based on the fact it has a headache and head CT is negative however it is possible that this process as could be going on and that it could have been a headache and then new onset stroke today but doubt that. More concerning for brain neoplasm if anything at all.  Shelda Jakes, MD 03/05/12 [phone]

## 2012-03-05 NOTE — ED Notes (Signed)
C/o HA to top of head x 4 days-c/o over all body weakness at that time-c/o weakness to right arm that started approx 9am while at work-pt drove self to ED-NAD at present

## 2012-03-05 NOTE — ED Provider Notes (Signed)
History     CSN: [id number]  Arrival date & time 03/05/12  1207   First MD Initiated Contact with Patient 03/05/12 1246      Chief Complaint  Patient presents with  . Headache  . Weakness    (Consider location/radiation/quality/duration/timing/severity/associated sxs/prior treatment) HPI Comments: Patient complains of a generalized headache for 4 days. She also complains of generalized weakness and malaise. She also states that she developed some weakness to her right arm that began earlier on the day of ED arrival. She describes the sensation as heaviness in her arm.  She denies any radiation of weakness or pain.  She describes the headache as a throbbing sensation. She reports similar headaches in the past. This headache has been intermittent.  She also reports increased stress at work and attributes her symptoms to stress.  She denies nausea, vomiting, fever, neck stiffness, chest pain or dyspnea.  Patient is a 56 y.o. female presenting with headaches. The history is provided by the patient. No language interpreter was used.  Headache  This is a recurrent problem. The current episode started more than 2 days ago. Episode frequency: Intermittently. The problem has not changed since onset.The headache is associated with emotional stress. Pain location: Diffuse. The quality of the pain is described as throbbing. The pain is moderate. The pain does not radiate. Associated symptoms include malaise/fatigue. Pertinent negatives include no fever, no chest pressure, no near-syncope, no orthopnea, no palpitations, no syncope, no shortness of breath, no nausea and no vomiting. Associated symptoms comments: Generalized weakness. She has tried nothing for the symptoms. The treatment provided no relief.    Past Medical History  Diagnosis Date  . Hypertension     Past Surgical History  Procedure Date  . Uterine fibroid surgery 1995  . Laparoscopic gastric banding 05/2009    Family History    Problem Relation Age of Onset  . Diabetes Mother     TYPE ii  . Hypertension Mother   . Stroke Mother   . Hyperlipidemia Mother   . Heart attack Father   . Kidney disease Father   . Hypertension Father   . Heart disease Father     CABG  . Hyperlipidemia Father   . Diabetes Sister   . Hypertension Sister   . Diabetes Brother   . Hypertension Brother   . Cancer Brother     lymphoma  . Diabetes Sister   . Hypertension Sister     History  Substance Use Topics  . Smoking status: Never Smoker   . Smokeless tobacco: Never Used  . Alcohol Use: No    OB History    Grav Para Term Preterm Abortions TAB SAB Ect Mult Living                  Review of Systems  Constitutional: Positive for malaise/fatigue. Negative for fever, activity change and appetite change.  HENT: Negative for facial swelling, trouble swallowing, neck pain and neck stiffness.   Eyes: Negative for visual disturbance.  Respiratory: Negative for shortness of breath.   Cardiovascular: Negative for chest pain, palpitations, orthopnea, syncope and near-syncope.  Gastrointestinal: Negative for nausea and vomiting.  Musculoskeletal: Negative for back pain and gait problem.  Neurological: Positive for weakness and headaches. Negative for dizziness, syncope, facial asymmetry, speech difficulty and numbness.  Psychiatric/Behavioral: Negative for confusion and decreased concentration.  All other systems reviewed and are negative.    Allergies  Review of patient's allergies indicates no known allergies.  Home Medications  Current Outpatient Rx  Name Route Sig Dispense Refill  . AMLODIPINE BESYLATE 5 MG PO TABS Oral Take 1 tablet (5 mg total) by mouth daily. 30 tablet 0  . ASPIRIN 81 MG PO TBEC Oral Take 81 mg by mouth daily.      Marland Kitchen DICLOFENAC SODIUM 75 MG PO TBEC Oral Take 1 tablet (75 mg total) by mouth 2 (two) times daily with a meal. 60 tablet 2  . OMEGA-3 FATTY ACIDS 1000 MG PO CAPS Oral Take 1 g by mouth  daily.      Marland Kitchen METOPROLOL TARTRATE 50 MG PO TABS Oral Take 1 tablet (50 mg total) by mouth 2 (two) times daily. 60 tablet 3  . TRAMADOL HCL 50 MG PO TABS      . TRIAMTERENE-HCTZ 37.5-25 MG PO TABS Oral Take 1 tablet by mouth daily. 30 tablet 3     # [id number] Supervising Physician Dr. Thomos Lemons ...    BP 129/81  Pulse 65  Temp(Src) 97.7 F (36.5 C) (Oral)  Resp 16  Ht 5\' 3"  (1.6 m)  Wt 189 lb (85.73 kg)  BMI 33.48 kg/m2  SpO2 98%  LMP 12/26/2009  Physical Exam  Nursing note and vitals reviewed. Constitutional: She is oriented to person, place, and time. She appears well-developed and well-nourished. No distress.  HENT:  Head: Normocephalic and atraumatic.  Mouth/Throat: Oropharynx is clear and moist.  Eyes: EOM are normal. Pupils are equal, round, and reactive to light.  Neck: Normal range of motion. Neck supple.  Cardiovascular: Normal rate, regular rhythm, normal heart sounds and intact distal pulses.   No murmur heard. Pulmonary/Chest: Effort normal and breath sounds normal. No respiratory distress.  Abdominal: Soft. She exhibits no distension. There is no tenderness.  Musculoskeletal: Normal range of motion. She exhibits no tenderness.  Lymphadenopathy:    She has no cervical adenopathy.  Neurological: She is alert and oriented to person, place, and time. No cranial nerve deficit or sensory deficit. She exhibits normal muscle tone. Coordination and gait normal. GCS eye subscore is 4. GCS verbal subscore is 5. GCS motor subscore is 6.  Reflex Scores:      Tricep reflexes are 2+ on the right side and 2+ on the left side.      Bicep reflexes are 2+ on the right side and 2+ on the left side.      Brachioradialis reflexes are 2+ on the right side and 2+ on the left side.      Patellar reflexes are 2+ on the right side and 2+ on the left side.      Achilles reflexes are 2+ on the right side and 2+ on the left side. Skin: Skin is warm and dry.  Psychiatric: She has a normal  mood and affect. Her behavior is normal. Thought content normal.    ED Course  Procedures (including critical care time)  Results for orders placed during the hospital encounter of 03/05/12  CBC      Component Value Range   WBC 6.5  4.0 - 10.5 (K/uL)   RBC 4.58  3.87 - 5.11 (MIL/uL)   Hemoglobin 14.6  12.0 - 15.0 (g/dL)   HCT 45.4  09.8 - 11.9 (%)   MCV 89.1  78.0 - 100.0 (fL)   MCH 31.9  26.0 - 34.0 (pg)   MCHC 35.8  30.0 - 36.0 (g/dL)   RDW 14.7  82.9 - 56.2 (%)   Platelets 316  150 - 400 (K/uL)  DIFFERENTIAL  Component Value Range   Neutrophils Relative 35 (*) 43 - 77 (%)   Neutro Abs 2.3  1.7 - 7.7 (K/uL)   Lymphocytes Relative 53 (*) 12 - 46 (%)   Lymphs Abs 3.4  0.7 - 4.0 (K/uL)   Monocytes Relative 9  3 - 12 (%)   Monocytes Absolute 0.6  0.1 - 1.0 (K/uL)   Eosinophils Relative 3  0 - 5 (%)   Eosinophils Absolute 0.2  0.0 - 0.7 (K/uL)   Basophils Relative 1  0 - 1 (%)   Basophils Absolute 0.0  0.0 - 0.1 (K/uL)  BASIC METABOLIC PANEL      Component Value Range   Sodium 136  135 - 145 (mEq/L)   Potassium 4.0  3.5 - 5.1 (mEq/L)   Chloride 98  96 - 112 (mEq/L)   CO2 28  19 - 32 (mEq/L)   Glucose, Bld 114 (*) 70 - 99 (mg/dL)   BUN 11  6 - 23 (mg/dL)   Creatinine, Ser 1.61  0.50 - 1.10 (mg/dL)   Calcium 09.6  8.4 - 10.5 (mg/dL)   GFR calc non Af Amer 71 (*) >90 (mL/min)   GFR calc Af Amer 83 (*) >90 (mL/min)  TROPONIN I      Component Value Range   Troponin I <0.30  <0.30 (ng/mL)    Ct Head Wo Contrast  03/05/2012  *RADIOLOGY REPORT*  Clinical Data: Headache  CT HEAD WITHOUT CONTRAST  Technique:  Contiguous axial images were obtained from the base of the skull through the vertex without contrast.  Comparison: 11/20/2010  Findings: No evidence of parenchymal hemorrhage or extra-axial fluid collection. No mass lesion, mass effect, or midline shift.  No CT evidence of acute infarction.  Cerebral volume is age appropriate.  No ventriculomegaly.  The visualized paranasal  sinuses are essentially clear. The mastoid air cells are unopacified.  No evidence of calvarial fracture.  IMPRESSION: Normal head CT.  Original Report Authenticated By: Charline Bills, M.D.        MDM     Date: 03/05/2012  Rate: 62  Rhythm: normal sinus rhythm  QRS Axis: normal  Intervals: normal  ST/T Wave abnormalities: nonspecific T wave changes  Conduction Disutrbances:none  Narrative Interpretation:   Old EKG Reviewed: none available    EKG reviewed by Dr. Deretha Emory  Patient is feeling better and requesting to go home. She is stable for discharge at this time.  Patient was gradual onset of generalized headache for 4 days. She had an intermittent episode of weakness to her right arm and right leg that began about 9 AM this morning symptoms haven't improved. No focal neuro deficits on her exam, she moves all extremities without difficulty. No facial deficits. She is mentating well. No meningeal signs. She is nontoxic appearing. Labs and CT today are within normal limits I have discussed patient's history and findings with the ER physician, patient has also been seen and evaluated by the ER physician and care plan was discussed.  Have discussed with patient the importance of close followup with her primary care physician for probable outpatient MRI.  She verbalized understanding to me and agrees to close followup. She also agrees to return here if her symptoms worsen.  Patient / Family / Caregiver understand and agree with initial ED impression and plan with expectations set for ED visit. Pt stable in ED with no significant deterioration in condition. Pt feels improved after observation and/or treatment in ED.  Aariah Godette L. [place], [place] 03/07/12 2313

## 2012-03-05 NOTE — ED Notes (Signed)
Pt amb to room 3 with quick steady gait smiling in nad. Pt reports generalized weakness and headache since Friday. Pt smile is symmetrical, PEARL, speech is clear, pt oriented x 4, denies any visual changes or deficits, left grip is strong, right grip is moderate.

## 2012-03-05 NOTE — ED Notes (Signed)
Pt declines iv access or lab draw at this time, "let's wait and see what the doctor wants to do.Marland KitchenMarland Kitchen"

## 2012-03-07 ENCOUNTER — Ambulatory Visit (INDEPENDENT_AMBULATORY_CARE_PROVIDER_SITE_OTHER): Payer: BC Managed Care – PPO | Admitting: Family

## 2012-03-07 VITALS — BP 140/86 | HR 82 | Temp 98.0°F | Resp 16 | Ht 61.5 in | Wt 227.1 lb

## 2012-03-07 DIAGNOSIS — R531 Weakness: Secondary | ICD-10-CM

## 2012-03-07 DIAGNOSIS — R5381 Other malaise: Secondary | ICD-10-CM

## 2012-03-07 DIAGNOSIS — M6281 Muscle weakness (generalized): Secondary | ICD-10-CM

## 2012-03-07 MED ORDER — SUMATRIPTAN SUCCINATE 50 MG PO TABS: ORAL_TABLET | ORAL | Status: DC

## 2012-03-07 NOTE — Assessment & Plan Note (Signed)
This is not new.  Had same symptoms back in 2010 and again in 2011 and was admitted to [facility] both times and had a negative workup.  It was recommended that the pt complete CT of the Cspine but she "no showed" her radiology appointment.  I will go ahead and order an MRI of the brain- she is requesting open MRI, and then refer her to neurology for further evaluation.

## 2012-03-07 NOTE — Progress Notes (Signed)
Subjective:    Patient ID: Monica Phillips, female    DOB: Jan 30, 1956, 56 y.o.   MRN: [id number]  HPI  Ms.  Monica Phillips is a 56 yr old female who presents today for ER follow up.  ED records are reviewed.  She was evaluated in the ED on Monday due to R sided weakness and headache.  She underwent cardiac enzymes which were negative as well as a neg CT head.  She reports that her symptoms started on Saturday 3/9.  She reports that she had some generalized weakness- pushed herself to do things.  Monday her HA worsened and had worsening right sided weakness.  Went to the ED.  She reports that she is feeling mild weakness today (though improved from her visit on Monday) and mild HA.  She has been using tylenol for HA with mild improvement.  She reports that her HA is like a "migraine."  Denies photophobia, phonophobia.  Some nausea with HA's.     Review of Systems See HPI  Past Medical History  Diagnosis Date  . Hypertension     History   Social History  . Marital Status: Divorced    Spouse Name: N/A    Number of Children: 2  . Years of Education: N/A   Occupational History  . [employer]   Social History Main Topics  . Smoking status: Never Smoker   . Smokeless tobacco: Never Used  . Alcohol Use: No  . Drug Use: Not on file  . Sexually Active: Not on file   Other Topics Concern  . 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 Uterine fibroid surgery 1995  . Laparoscopic gastric banding 05/2009    Family History  Problem Relation Age of Onset  . Diabetes Mother     TYPE ii  . Hypertension Mother   . Stroke Mother   . Hyperlipidemia Mother   . Heart attack Father   . Kidney disease Father   . Hypertension Father   . Heart disease Father     CABG  . Hyperlipidemia Father   . Diabetes Sister   . Hypertension Sister   . Diabetes Brother   .  Hypertension Brother   . Cancer Brother     lymphoma  . Diabetes Sister   . Hypertension Sister     No Known Allergies  Current Outpatient Prescriptions on File Prior to Visit  Medication Sig Dispense Refill  . aspirin 81 MG EC tablet Take 81 mg by mouth daily.        . fish oil-omega-3 fatty acids 1000 MG capsule Take 1 g by mouth daily.        . metoprolol (LOPRESSOR) 50 MG tablet Take 1 tablet (50 mg total) by mouth 2 (two) times daily.  60 tablet  3  . traMADol (ULTRAM) 50 MG tablet       . triamterene-hydrochlorothiazide (MAXZIDE-25) 37.5-25 MG per tablet Take 1 tablet by mouth daily.  30 tablet  3    BP 140/86  Pulse 82  Temp(Src) 98 F (36.7 C) (Oral)  Resp 16  Ht 5' 1.5" (1.562 m)  Wt 227 lb 1.3 oz (103.003 kg)  BMI 42.21 kg/m2  SpO2 98%  LMP 12/26/2009       Objective:   Physical Exam  Constitutional: She appears well-developed and well-nourished. No distress.  Eyes: EOM are normal.  Cardiovascular: Normal rate and regular rhythm.   No murmur heard. Pulmonary/Chest: Effort normal and breath sounds normal. No respiratory distress. She has no wheezes. She has no rales. She exhibits no tenderness.  Neurological:       RUE hand grasp is mildly diminished.  RUE strength 4-5/5 RLE strength 4-5/5.  LUE, L hand grasp and LLE strength is 5/5 Tongue midline Speech is clear  A and O x 3  Psychiatric: She has a normal mood and affect. Her behavior is normal. Judgment and thought content normal.          Assessment & Plan:

## 2012-03-07 NOTE — Patient Instructions (Signed)
You will be contacted about your referral to neurology. Return to the ER if you develop worsening weakness/numbness, slurred speech. Follow up in 6 weeks.

## 2012-03-08 ENCOUNTER — Encounter: Payer: Self-pay | Admitting: Neurology

## 2012-03-09 ENCOUNTER — Telehealth: Payer: Self-pay | Admitting: Family

## 2012-03-09 MED ORDER — LORAZEPAM 1 MG PO TABS: ORAL_TABLET | ORAL | Status: DC

## 2012-03-09 NOTE — ED Provider Notes (Signed)
Medical screening examination/treatment/procedure(s) were performed by non-physician practitioner and as supervising physician I was immediately available for consultation/collaboration.   Loralai Eisman W. Garry Nicolini, MD 03/09/12 2335 

## 2012-03-09 NOTE — Telephone Encounter (Addendum)
Left message requesting that pt return our call- when she calls back, pls let her know that rx for ativan has been sent to pharmacy.  Also, I reviewed her lab work from the ED as well as a diabetes screening test and it shows mild elevation of her sugar.  She should work on diet, exercise, weight loss, avoid concentrated sweets.  These changes will all help her sugar. We will need to keep an eye on her sugar to make sure that she does not progress to diabetes.

## 2012-03-09 NOTE — Telephone Encounter (Signed)
Patient is scheduled for open MRI tomorrow , per  Eber Jones in imaging  ,patient states she is very claustorphobic wants meds called in to [facility]

## 2012-03-09 NOTE — Telephone Encounter (Signed)
Notified pt and she voices understanding. 

## 2012-03-10 ENCOUNTER — Ambulatory Visit (HOSPITAL_BASED_OUTPATIENT_CLINIC_OR_DEPARTMENT_OTHER)
Admission: RE | Admit: 2012-03-10 | Discharge: 2012-03-10 | Disposition: A | Discharge status: Home / Self Care | Payer: BC Managed Care – PPO | Source: Ambulatory Visit | Attending: Family | Admitting: Family

## 2012-03-10 DIAGNOSIS — R51 Headache: Secondary | ICD-10-CM

## 2012-03-10 DIAGNOSIS — G510 Bell's palsy: Secondary | ICD-10-CM

## 2012-03-10 DIAGNOSIS — R5381 Other malaise: Secondary | ICD-10-CM

## 2012-03-10 DIAGNOSIS — R209 Unspecified disturbances of skin sensation: Secondary | ICD-10-CM

## 2012-03-10 DIAGNOSIS — R531 Weakness: Secondary | ICD-10-CM

## 2012-03-10 DIAGNOSIS — R5383 Other fatigue: Secondary | ICD-10-CM

## 2012-03-13 ENCOUNTER — Telehealth: Payer: Self-pay | Admitting: Family

## 2012-03-13 MED ORDER — TRAMADOL HCL 50 MG PO TABS: 50.0000 mg | ORAL_TABLET | Freq: Three times a day (TID) | ORAL | Status: DC | PRN

## 2012-03-13 NOTE — Telephone Encounter (Signed)
Pt called back.  Reports that she still has HA.  Imitrex helped a little, but HA is not gone.  "I can't wait until May with this pain" (to see neurology). Will send rx to pharmacy for tramadol prn.  Will ask referral coordinator to try to get pt placed on cancellation list at Dr. Nash Dimmer office.

## 2012-03-13 NOTE — Telephone Encounter (Signed)
Patient is requesting MRI results.

## 2012-03-13 NOTE — Telephone Encounter (Signed)
Reviewed results with pt.  Recommended that she keep her upcoming appointment with neurology.

## 2012-03-13 NOTE — Telephone Encounter (Signed)
Please advise 

## 2012-03-16 ENCOUNTER — Telehealth: Payer: Self-pay | Admitting: Family

## 2012-03-16 NOTE — Telephone Encounter (Signed)
Message copied by Sandford Craze on Fri Mar 16, 2012 10:38 AM ------      Message from: Darral Dash E      Created: Thu Mar 15, 2012  9:31 AM          OK  She has been place on cancellation list for Dr Modesto Charon               ----- Message -----         From: Sandford Craze, NP         Sent: 03/13/2012   4:20 PM           To: Vilinda Flake,            Would you pls aske them to place her on their cancellation list for earlier apt? thanks

## 2012-03-20 ENCOUNTER — Encounter: Payer: Self-pay | Admitting: Neurology

## 2012-03-27 ENCOUNTER — Telehealth: Payer: Self-pay | Admitting: *Deleted

## 2012-03-27 NOTE — Telephone Encounter (Signed)
The swallowing issue should be evaluated separately.  She should be seen in the office for this within the next 1 week please.

## 2012-03-27 NOTE — Telephone Encounter (Signed)
Received call from pt reporting return of symptoms (headache and right side body weakness). Pt has contacted neurology and will be seeing Dr Modesto Charon on Thursday at 1:30pm. Pt reports that symptoms are the same as previous episodes. Advised pt per Sandford Craze, NP that she should keep appt on Thursday with Dr Modesto Charon and she should be evaluated in the ER if her symptoms become severe between now and then. Pt reports new symptom of difficulty swallowing, getting strangled easily since last week. Reports that this occurs separately from other symptoms. Please advise.

## 2012-03-27 NOTE — Telephone Encounter (Signed)
Notified pt and scheduled f/u for 04/02/12 at 3:30pm.

## 2012-03-29 ENCOUNTER — Ambulatory Visit (INDEPENDENT_AMBULATORY_CARE_PROVIDER_SITE_OTHER): Payer: BC Managed Care – PPO | Admitting: Neurology

## 2012-03-29 ENCOUNTER — Encounter: Payer: Self-pay | Admitting: Neurology

## 2012-03-29 DIAGNOSIS — G43109 Migraine with aura, not intractable, without status migrainosus: Secondary | ICD-10-CM

## 2012-03-29 NOTE — Progress Notes (Signed)
Dear Ms. Monica Phillips,  Thank you for having me see Monica Phillips in consultation today at [facility] for her problem with right sided weakness and probable complicated migraines.  As you may recall, she is a 56 y.o. year old female with a history of hypertension who has had repeated spells of headache that is described as bifrontal and throbbing sometimes with nausea that can last hours. These have been accompanied by exacerbation of right-sided weakness. She also feels that there is a numbness at times on her right side. She's had multiple emergency room visits and been given the diagnosis of complicated migraine. These episodes were going on for approximately 3-4 years. However she says after her first episode she's had persistent weakness on the right side. Multiple MRIs of her brain had been unremarkable. She has had some white matter disease seen but does not explain her symptoms. She was having 1-2 of these episodes a year but has had to only 4 days apart several weeks ago.  She's not been on a preventative medication for these episodes. As an abortive she's used Tylenol although this has been ineffective. She may have tried Advil at a time and had some results.  She is most concerned today about her residual right-sided weakness. She says she is unable to use the arm properly. For example she cannot pick up a frying pan with it.  Past Medical History  Diagnosis Date  . Hypertension     Past Surgical History  Procedure Date  . Uterine fibroid surgery 1995  . Laparoscopic gastric banding 05/2009    History   Social History  . Marital Status: Divorced    Spouse Name: N/A    Number of Children: 2  . Years of Education: N/A   Occupational History  . [employer]   Social History Main Topics  . Smoking status: Never Smoker   . Smokeless tobacco: Never Used  . Alcohol Use: No  . Drug Use: None  . Sexually Active: None   Other Topics Concern  . 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 Diabetes Mother     TYPE ii  . Hypertension Mother   . Stroke Mother   . Hyperlipidemia Mother   . Heart attack Father   . Kidney disease Father   . Hypertension Father   . Heart disease Father     CABG  . Hyperlipidemia Father   . Diabetes Sister   . Hypertension Sister   . Diabetes Brother   . Hypertension Brother   . Cancer Brother     lymphoma  . Diabetes Sister   . Hypertension Sister   - no history of migraine headaches  Current Outpatient Prescriptions on File Prior to Visit  Medication Sig Dispense Refill  . aspirin 81 MG EC tablet Take 81 mg by mouth daily.        . fish oil-omega-3 fatty acids 1000 MG capsule Take 1 g by mouth daily.        . metoprolol (LOPRESSOR) 50 MG tablet Take 1 tablet (50 mg total) by mouth 2 (two) times daily.  60 tablet  3  . SUMAtriptan (IMITREX) 50 MG tablet One tab at start of migraine, may repeat 2 hours later as needed (max 2 tabs/day)  10 tablet  0  . traMADol (ULTRAM) 50 MG tablet Take 1 tablet (  50 mg total) by mouth every 8 (eight) hours as needed for pain.  30 tablet  0  . triamterene-hydrochlorothiazide (MAXZIDE-25) 37.5-25 MG per tablet Take 1 tablet by mouth daily.  30 tablet  3  . DISCONTD: amLODipine (NORVASC) 5 MG tablet Take 1 tablet (5 mg total) by mouth daily.  30 tablet  0    No Known Allergies    ROS:  13 systems were reviewed and are notable for chronic left leg pain.  All other review of systems are unremarkable.   Examination:  Filed Vitals:   03/29/12 1331  BP: 136/80  Pulse: 70  Height: 5' 3.5" (1.613 m)  Weight: 226 lb (102.513 kg)     In general, well appearing women.  H&N: reveals a slightly smaller right palpebral fissure that is chronic.  Cardiovascular: The patient has a regular rate and rhythm and no carotid bruits.  Fundoscopy:  Disks are  flat. Vessel caliber within normal limits.  Mental status:   The patient is oriented to person, place and time. Recent and remote memory are intact. Attention span and concentration are normal. Language including repetition, naming, following commands are intact. Fund of knowledge of current and historical events, as well as vocabulary are normal.  Cranial Nerves: Pupils are equally round and reactive to light. Visual fields full to confrontation. Extraocular movements are intact without nystagmus. Facial sensation decreased to light touch on the right lower face, but equal to temperature. Muscles of facial expression reveal possible nasal labial fold flattening on the right. Hearing intact to bilateral finger rub. Tongue protrusion, uvula, palate midline.  Shoulder shrug intact  Motor:  The patient has normal bulk and tone.  Downward drift on the right with no pronation.  No orbiting. There are no adventitious movements.  She has giveaway weakness on the right across all muscle groups, but with encouragement is able to give full strength.  Reflexes:  1+ throughout.  Toes down  Coordination:  Normal finger to nose.  No dysdiadokinesia.  Sensation is intact to temperature and vibration and symmetric.  Gait and Station are normal.  Tandem gait is intact.  Romberg is negative  MRI brain was reviewed and reveals minimal WMD but nothing that would explain her right sided weakness.  Impression/Recs: 1.  Right sided weakness - I don't have a great explanation for her right sided weakness other than I have a strong suspicion it is functional.  I have encouraged her to use the right side as she tends to use the left more.  It is possible she had a very small stroke with her first "migrainous event" that left her with residual right sided weakness but without any definite findings I would find it hard to push this as a credible theory. 2.  Complicated migraines - I have encouraged her to use Aleve  400mg  bid when she gets these headaches.  Because of the worsening right sided weakness during these events Imitrex is generally contraindicated and she should try to avoid the vasoconstrictive agents.  If she continues to have these events at a greater frequency we can consider a prophylactic agent.   We will see the patient back in 2 months.  Thank you for having [place] see Alvie Dolle in consultation.  Feel free to contact me with any questions.  Lupita Raider Modesto Charon, MD [facility], [place] [address] [place], [place] [address] Phone: [phone] Fax: [phone].

## 2012-04-02 ENCOUNTER — Ambulatory Visit: Payer: Self-pay | Admitting: Family

## 2012-04-03 ENCOUNTER — Ambulatory Visit (INDEPENDENT_AMBULATORY_CARE_PROVIDER_SITE_OTHER): Payer: BC Managed Care – PPO | Admitting: Family

## 2012-04-03 ENCOUNTER — Encounter: Payer: Self-pay | Admitting: Family

## 2012-04-03 VITALS — BP 110/84 | HR 65 | Temp 98.1°F | Resp 16 | Wt 229.1 lb

## 2012-04-03 DIAGNOSIS — R1310 Dysphagia, unspecified: Secondary | ICD-10-CM

## 2012-04-03 DIAGNOSIS — M6281 Muscle weakness (generalized): Secondary | ICD-10-CM

## 2012-04-03 DIAGNOSIS — R531 Weakness: Secondary | ICD-10-CM

## 2012-04-03 MED ORDER — ESOMEPRAZOLE MAGNESIUM 40 MG PO CPDR: 40.0000 mg | DELAYED_RELEASE_CAPSULE | Freq: Every day | ORAL | Status: AC

## 2012-04-03 NOTE — Assessment & Plan Note (Signed)
Work up has been negative.  Felt to be functional in nature. Recommended PT.  She declines at this time.  Tells me that she wishes to focus on walking on her own.

## 2012-04-03 NOTE — Assessment & Plan Note (Signed)
New.  Pt is s/p Lap band, which may be contributing to her symptoms.  Will start nexium.  Pt instructed to call me in about 2 weeks and let me know if this is helping her symptoms.  If not, will plan to refer her to GI for further evaluation.

## 2012-04-03 NOTE — Progress Notes (Signed)
Subjective:    Patient ID: Monica Phillips, female    DOB: 11/22/1956, 56 y.o.   MRN: [id number]  HPI  Ms.  [name] is a 56 yr old female who presents today for follow up. She has chief complaint of Dysphagia.  Dysphagia- notes that food feels like it gets stuck at the top of her throat.  She drinks water to help ease it down.  Also has sensation of water getting stuck briefly.   Hx of lap band.  Symptoms started Wednesday of last week. Denies vomitting.    R sided weakness- saw Dr. Modesto Charon- felt likely to be functional.    Complicated migraines- She is now using aleve PRN.      Review of Systems See HPI  Past Medical History  Diagnosis Date  . Hypertension     History   Social History  . Marital Status: Divorced    Spouse Name: N/A    Number of Children: 2  . Years of Education: N/A   Occupational History  . [employer]   Social History Main Topics  . Smoking status: Never Smoker   . Smokeless tobacco: Never Used  . Alcohol Use: No  . Drug Use: Not on file  . Sexually Active: Not on file   Other Topics Concern  . 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 Uterine fibroid surgery 1995  . Laparoscopic gastric banding 05/2009    Family History  Problem Relation Age of Onset  . Diabetes Mother     TYPE ii  . Hypertension Mother   . Stroke Mother   . Hyperlipidemia Mother   . Heart attack Father   . Kidney disease Father   . Hypertension Father   . Heart disease Father     CABG  . Hyperlipidemia Father   . Diabetes Sister   . Hypertension Sister   . Diabetes Brother   . Hypertension Brother   . Cancer Brother     lymphoma  . Diabetes Sister   . Hypertension Sister     No Known Allergies  Current Outpatient Prescriptions on File Prior to Visit  Medication Sig Dispense Refill  . aspirin 81 MG EC tablet Take 81 mg by mouth  daily.        . fish oil-omega-3 fatty acids 1000 MG capsule Take 1 g by mouth daily.        . metoprolol (LOPRESSOR) 50 MG tablet Take 1 tablet (50 mg total) by mouth 2 (two) times daily.  60 tablet  3  . triamterene-hydrochlorothiazide (MAXZIDE-25) 37.5-25 MG per tablet Take 1 tablet by mouth daily.  30 tablet  3  . esomeprazole (NEXIUM) 40 MG capsule Take 1 capsule (40 mg total) by mouth daily.  28 capsule  0  . DISCONTD: amLODipine (NORVASC) 5 MG tablet Take 1 tablet (5 mg total) by mouth daily.  30 tablet  0    BP 110/84  Pulse 65  Temp(Src) 98.1 F (36.7 C) (Oral)  Resp 16  Wt 229 lb 1.9 oz (103.928 kg)  SpO2 99%  LMP 12/26/2009       Objective:   Physical Exam  Constitutional: She appears well-developed and well-nourished. No distress.  Cardiovascular: Normal rate and regular rhythm.   No murmur heard. Pulmonary/Chest: Effort normal and breath sounds normal. No respiratory distress. She has no wheezes. She has no rales.  She exhibits no tenderness.  Psychiatric: She has a normal mood and affect. Her behavior is normal. Judgment and thought content normal.          Assessment & Plan:

## 2012-04-03 NOTE — Patient Instructions (Signed)
Please call me in 2 weeks and let me know how your swallowing is doing on the nexium. Follow up in 3 months for an appointment.

## 2012-04-05 ENCOUNTER — Telehealth: Payer: Self-pay | Admitting: Neurology

## 2012-04-05 NOTE — Telephone Encounter (Signed)
Received a call from the patient. She reportst the right sided weakness and HA continue; no improvement since she was seen by Dr. Modesto Charon last week. She states her HA occur on the left side of her head and the weakness is on her right side. HA pain is rated a "7" out of a "10". She denies trouble walking or other symptoms although she does say she feels like she is going to fall at times. Taking Aleve BID as recommended by Dr. Modesto Charon. Has not taken the Imitrex. Nothing thus far has helped. I told her I would let Dr. Modesto Charon know that her symptoms continue and that we will be in touch with her tomorrow. The patient agrees with this plan. [facility]acy of choice is [facility] on [address]. **Dr. Modesto Charon, last office note mentions starting a prophylactic if sx ongoing. Please advise.

## 2012-04-06 ENCOUNTER — Other Ambulatory Visit: Payer: Self-pay | Admitting: Neurology

## 2012-04-06 MED ORDER — PROCHLORPERAZINE MALEATE 5 MG PO TABS: 5.0000 mg | ORAL_TABLET | Freq: Four times a day (QID) | ORAL | Status: DC | PRN

## 2012-04-06 MED ORDER — AMITRIPTYLINE HCL 25 MG PO TABS: ORAL_TABLET | ORAL | Status: AC

## 2012-04-06 NOTE — Telephone Encounter (Signed)
Left the patient a message on her home/mobile number that medications called to her pharmacy.

## 2012-04-06 NOTE — Telephone Encounter (Signed)
Why don't we try Elavil 12.5mg  at night to increase to 25mg  after that as a preventative.  Just emphasize she needs to take this every night.  For when the headache gets bad we can give her compazine 5mg  q6h prn 30 tabs, no refills.  Let her know that this can make her sleepy.

## 2012-04-17 ENCOUNTER — Ambulatory Visit (INDEPENDENT_AMBULATORY_CARE_PROVIDER_SITE_OTHER): Payer: BC Managed Care – PPO | Admitting: Family

## 2012-04-17 ENCOUNTER — Telehealth: Payer: Self-pay | Admitting: Family

## 2012-04-17 ENCOUNTER — Telehealth: Payer: Self-pay | Admitting: Neurology

## 2012-04-17 ENCOUNTER — Encounter: Payer: Self-pay | Admitting: Family

## 2012-04-17 VITALS — BP 160/86 | HR 83 | Temp 98.0°F | Resp 16 | Wt 227.0 lb

## 2012-04-17 DIAGNOSIS — M6281 Muscle weakness (generalized): Secondary | ICD-10-CM

## 2012-04-17 DIAGNOSIS — R531 Weakness: Secondary | ICD-10-CM

## 2012-04-17 DIAGNOSIS — I10 Essential (primary) hypertension: Secondary | ICD-10-CM

## 2012-04-17 DIAGNOSIS — G43109 Migraine with aura, not intractable, without status migrainosus: Secondary | ICD-10-CM | POA: Insufficient documentation

## 2012-04-17 NOTE — Telephone Encounter (Signed)
Pt does not like new Elavil rx. She states that it makes her heart race. Please call and advise.

## 2012-04-17 NOTE — Assessment & Plan Note (Signed)
Recommended referral to PT for strengthening.  She is now favoring the left side and as a result causing increased atrophy to the right.  Per neuro, it is felt that her right sided weakness is most likely functional.

## 2012-04-17 NOTE — Telephone Encounter (Signed)
Called and left a message for the patient to call me tomorrow and let me know how she is feeling. (she didn't go to the ED; she was seen by the NP at her PCP's office at 2:30 pm today).

## 2012-04-17 NOTE — Progress Notes (Signed)
Subjective:    Patient ID: Monica Phillips, female    DOB: 12-31-1955, 56 y.o.   MRN: [id number]  HPI  Ms.  Primiano is a 56 yr old female with hx of complicated migraine who presents today with complaint of right sided weakness.  She has long standing hx of right sided weakness with a negative neuro work up.  She notes that her weakness worsened today in the right arm and the right leg after she went to work.  She reports frontal HA "radiates through my head." She denies associated numbness but does note some arm tingling.  She saw Dr. Modesto Charon who prescribed elavil for her migraines. She feels that this medication caused her heart to race.  Her last dose was this AM.  HTN- tells me that she forgot to take her AM BP meds today.   Review of Systems See HPI  Past Medical History  Diagnosis Date  . Hypertension     History   Social History  . Marital Status: Divorced    Spouse Name: N/A    Number of Children: 2  . Years of Education: N/A   Occupational History  . [employer]   Social History Main Topics  . Smoking status: Never Smoker   . Smokeless tobacco: Never Used  . Alcohol Use: No  . Drug Use: Not on file  . Sexually Active: Not on file   Other Topics Concern  . 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 Uterine fibroid surgery 1995  . Laparoscopic gastric banding 05/2009    Family History  Problem Relation Age of Onset  . Diabetes Mother     TYPE ii  . Hypertension Mother   . Stroke Mother   . Hyperlipidemia Mother   . Heart attack Father   . Kidney disease Father   . Hypertension Father   . Heart disease Father     CABG  . Hyperlipidemia Father   . Diabetes Sister   . Hypertension Sister   . Diabetes Brother   . Hypertension Brother   . Cancer Brother     lymphoma  . Diabetes Sister   . Hypertension Sister     No Known  Allergies  Current Outpatient Prescriptions on File Prior to Visit  Medication Sig Dispense Refill  . amitriptyline (ELAVIL) 25 MG tablet Take one half (1/2) tablet at HS for 2 weeks then one tab at HS thereafter.  30 tablet  2  . aspirin 81 MG EC tablet Take 81 mg by mouth daily.        Marland Kitchen esomeprazole (NEXIUM) 40 MG capsule Take 1 capsule (40 mg total) by mouth daily.  28 capsule  0  . fish oil-omega-3 fatty acids 1000 MG capsule Take 1 g by mouth daily.        . metoprolol (LOPRESSOR) 50 MG tablet Take 1 tablet (50 mg total) by mouth 2 (two) times daily.  60 tablet  3  . omeprazole (PRILOSEC) 20 MG capsule Take 20 mg by mouth daily.      Marland Kitchen triamterene-hydrochlorothiazide (MAXZIDE-25) 37.5-25 MG per tablet Take 1 tablet by mouth daily.  30 tablet  3  . DISCONTD: prochlorperazine (COMPAZINE) 5 MG tablet Take 1 tablet (5 mg total) by mouth every 6 (six) hours as needed (nausea and severe HA).  30 tablet  0  . DISCONTD: amLODipine (NORVASC)  5 MG tablet Take 1 tablet (5 mg total) by mouth daily.  30 tablet  0    BP 160/86  Pulse 83  Temp(Src) 98 F (36.7 C) (Oral)  Resp 16  Wt 227 lb 0.6 oz (102.985 kg)  SpO2 99%  LMP 12/26/2009       Objective:   Physical Exam  Constitutional: She is oriented to person, place, and time. She appears well-developed and well-nourished. No distress.  HENT:  Head: Normocephalic and atraumatic.  Eyes: Pupils are equal, round, and reactive to light.  Cardiovascular: Normal rate and regular rhythm.   No murmur heard. Pulmonary/Chest: Effort normal and breath sounds normal. No respiratory distress. She has no wheezes. She has no rales. She exhibits no tenderness.  Neurological: She is alert and oriented to person, place, and time. Coordination normal.       Steady even gait is noted.    RLE and RUE strength is 4-5/5 LLE and LUE strength is 5/5  Psychiatric: She has a normal mood and affect. Her behavior is normal. Judgment and thought content normal.           Assessment & Plan:

## 2012-04-17 NOTE — Patient Instructions (Addendum)
Please keep your follow up with Dr. Modesto Charon.  You will be contacted about your referral to Physical therapy. Follow up in 2 months.

## 2012-04-17 NOTE — Assessment & Plan Note (Signed)
Deteriorated. Did not take AM meds.  Reinforced compliance.

## 2012-04-17 NOTE — Telephone Encounter (Signed)
Pt called.  Reports continued HA, right sided weakness.  Advised pt to come to office for evaluation at 2:30PM today.

## 2012-04-17 NOTE — Assessment & Plan Note (Signed)
Stop elavil, continue advil prn.  Add prilosec for GI prophylaxis.

## 2012-04-17 NOTE — Telephone Encounter (Signed)
Pt called again and stated that she still wasn't feeling well and that she was going to go to the ER.

## 2012-04-18 ENCOUNTER — Telehealth: Payer: Self-pay | Admitting: Family

## 2012-04-18 MED ORDER — TRIAMTERENE-HCTZ 37.5-25 MG PO TABS: 1.0000 | ORAL_TABLET | Freq: Every day | ORAL | Status: DC

## 2012-04-18 MED ORDER — TRIAMTERENE-HCTZ 37.5-25 MG PO TABS: 1.0000 | ORAL_TABLET | Freq: Every day | ORAL | Status: AC

## 2012-04-18 MED ORDER — METOPROLOL TARTRATE 50 MG PO TABS: 50.0000 mg | ORAL_TABLET | Freq: Two times a day (BID) | ORAL | Status: AC

## 2012-04-18 NOTE — Telephone Encounter (Signed)
Refill- metoprolol tarta 50mg  tab. Take one tablet (50mg  total) by mouth two times daily. Qty 60 last fill 4.5.13  Refill- triam/hctz 37.5/25 tab. Take one tablet by mouth daily. Qty 30 last fill 4.5.13

## 2012-04-18 NOTE — Telephone Encounter (Signed)
Metoprolol #60 x 2 refills and traim/hctz #30 x 2 refills sent to pharmacy.

## 2012-04-18 NOTE — Telephone Encounter (Signed)
Called and left another message for the patient to return my call.

## 2012-04-18 NOTE — Telephone Encounter (Signed)
Addended by: Mervin Kung A on: 04/18/2012 01:35 PM   Modules accepted: Orders

## 2012-04-20 NOTE — Telephone Encounter (Signed)
The patient never returned my calls.

## 2012-04-27 ENCOUNTER — Ambulatory Visit: Payer: Self-pay | Admitting: Neurology

## 2012-05-14 ENCOUNTER — Telehealth: Payer: Self-pay | Admitting: *Deleted

## 2012-05-14 NOTE — Telephone Encounter (Signed)
Received call from pt stating she has had intermittent, sharp pains in the center of her chest since this weekend. Get so bad "I have to lay down with it". Pt denies radiating pain. Reports sob, nausea and loss of appetite. Confirmed with Provider and advised pt to be evaluated in the ER to rule out cardiac cause. Pt voices understanding.

## 2012-09-11 ENCOUNTER — Telehealth: Payer: Self-pay | Admitting: Family

## 2012-09-11 NOTE — Telephone Encounter (Signed)
Please call pt and let her know that her insurance sent me a letter reminding [place] that we need to follow up on her high cholesterol.  Please ask her to arrange a fasting physical.

## 2012-09-12 NOTE — Telephone Encounter (Signed)
Informed patient that she is due for a fasting physical to follow up on her cholesterol. Patient states that she is out of town and will call back to schedule this appointment.

## 2015-09-24 ENCOUNTER — Ambulatory Visit
Admit: 2015-09-24 | Discharge: 2015-09-24 | Payer: PRIVATE HEALTH INSURANCE | Attending: Urology | Primary: Family Medicine

## 2015-09-24 DIAGNOSIS — N281 Cyst of kidney, acquired: Secondary | ICD-10-CM
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
## 2015-10-14 ENCOUNTER — Encounter

## 2015-10-14 NOTE — Telephone Encounter (Signed)
EP of Dr. Lalla BrothersLambert scheduled for CT scan on Saturday but her insurance denied the authorization ( she got denial letter today ), she called to ask what to do next, please call her at: [phone].

## 2015-10-26 ENCOUNTER — Encounter: Attending: Urology | Primary: Family Medicine

## 2015-10-26 NOTE — Telephone Encounter (Signed)
Pt had her CT done Saturday at [facility], she would like a call with the results. I tried to make an appointment with Dr Lalla Brotherslambert and she insisted that Mel or Dr Lalla BrothersLambert call with results.//SDusza

## 2015-10-27 NOTE — Telephone Encounter (Signed)
Called pt and discussed CT results.  Right renal lesion does not enhance and appears to be a complex cyst on renal US.  Plan to repeat renal US in 6 months to monitor.     Romualdo BolkJack Lambert MD  Assistant Professor, Dept of Urology  [facility] [employer]  [facility] [place] [place]  Pager #: [phone]

## 2015-10-28 ENCOUNTER — Encounter

## 2015-11-06 ENCOUNTER — Encounter: Attending: Physician Assistant | Primary: Family Medicine

## 2016-04-06 ENCOUNTER — Encounter: Primary: Family Medicine

## 2016-04-11 ENCOUNTER — Ambulatory Visit: Admit: 2016-04-11 | Discharge: 2016-04-11 | Payer: PRIVATE HEALTH INSURANCE | Primary: Family Medicine

## 2016-04-11 DIAGNOSIS — N281 Cyst of kidney, acquired: Secondary | ICD-10-CM

## 2016-04-11 NOTE — Progress Notes (Signed)
Renal ultrasound done per office protocol.

## 2016-04-12 ENCOUNTER — Encounter: Attending: Urology | Primary: Family Medicine

## 2016-04-13 ENCOUNTER — Ambulatory Visit
Admit: 2016-04-13 | Discharge: 2016-04-13 | Payer: PRIVATE HEALTH INSURANCE | Attending: Urology | Primary: Family Medicine

## 2016-04-13 DIAGNOSIS — N281 Cyst of kidney, acquired: Secondary | ICD-10-CM

## 2016-04-13 LAB — AMB POC URINALYSIS DIP STICK AUTO W/O MICRO
Glucose (UA POC): NEGATIVE
Nitrites (UA POC): NEGATIVE
Specific gravity (UA POC): 1.02 (ref 1.001–1.035)
Urobilinogen (UA POC): 0.2 (ref 0.2–1)
pH (UA POC): 6 (ref 4.6–8.0)

## 2016-04-13 NOTE — Progress Notes (Signed)
[name]  DOB 09/19/1956   59 y.o. [name]  04/13/2016     UROLOGY ESTABLISHED PATIENT        ICD-10-CM ICD-9-CM   1. Renal cyst N28.1 753.10        Assessment:  1. Pt is a 60 y/o female with right 1.7 cm renal cyst (complex)    Renal US 04/11/2016 - Stable  2. HTN      Plan:   Reviewed Renal US, stable.   Will continue to monitor.   Ordered MRI ABD W W/O Cont   RTC in one year to review imaging.       Chief Complaint   Patient presents with   ??? Renal Cyst     Returns to review imaging.        Subjective:   [name] is a 60 y.o. African American female who returns in follow up for a 1.7cm septated cyst discovered incidentally.  Pt returns following completion of her Renal US.   Imaging shows minimal change in the size of the cyst in seven months.  At the time of diagnosis pt had no pain or related symptoms.   Today, she remains asymptomatic.     Overall, pt is doing well.   She denies any bothersome urinary symptoms, no hematuria.    Denies fever, chills, nausea, or vomiting.   No other concerning symptoms to discuss.     Current Outpatient Prescriptions   Medication Sig Dispense Refill   ??? metoprolol tartrate (LOPRESSOR) 25 mg tablet      ??? rosuvastatin (CRESTOR) 20 mg tablet      ??? amLODIPine (NORVASC) 10 mg tablet      ??? aspirin 81 mg chewable tablet Take 81 mg by mouth daily.     ??? LISINOPRIL PO Take  by mouth.     ??? amLODIPine (NORVASC) 5 mg tablet Take 5 mg by mouth daily.       No Known Allergies  Past Medical History:   Diagnosis Date   ??? Hypertension      Past Surgical History:   Procedure Laterality Date   ??? HX LAP GASTRIC BYPASS     ??? HX MYOMECTOMY       Family History   Problem Relation Age of Onset   ??? Hypertension Mother    ??? Diabetes Mother    ??? Kidney Disease Father    ??? Diabetes Sister    ??? Hypertension Sister    ??? Hypertension Brother    ??? Diabetes Brother    ??? Heart Disease Brother      Social History   Substance Use Topics   ??? Smoking status: Never Smoker    ??? Smokeless tobacco: Not on file   ??? Alcohol use No        Review of Systems  Constitutional: Fever: No  Skin: Rash: No  HEENT: Hearing difficulty: No  Eyes: Blurred vision: No  Cardiovascular: Chest pain: No  Respiratory: Shortness of breath: No  Gastrointestinal: Nausea/vomiting: No  Musculoskeletal: Back pain: No  Neurological: Weakness: No  Psychological: Memory loss: No  Comments/additional findings:       Objective:     Visit Vitals   ??? BP 128/76   ??? Ht 5\' 3"  (1.6 m)   ??? Wt 165 lb (74.8 kg)   ??? BMI 29.23 kg/m2   Constitutional: WDWN, Pleasant and appropriate affect, No acute distress.    HEENT: EOMs in tact  Respiratory: No respiratory distress or  difficulties  CV:  No peripheral swelling noted  Skin: Normal color and texture and No rashes or erythema noted  Neuro/Psych:  Alert and Oriented x3, affect appropriate.   EXT: no cyanosis or edema        Labs  Results for orders placed or performed in visit on 04/13/16   AMB POC URINALYSIS DIP STICK AUTO W/O MICRO   Result Value Ref Range    Color (UA POC) Yellow     Clarity (UA POC) Cloudy     Glucose (UA POC) Negative Negative    Bilirubin (UA POC) 1+ Negative    Ketones (UA POC) Trace Negative    Specific gravity (UA POC) 1.020 1.001 - 1.035    Blood (UA POC) 2+ Negative    pH (UA POC) 6.0 4.6 - 8.0    Protein (UA POC) 1+ Negative mg/dL    Urobilinogen (UA POC) 0.2 mg/dL 0.2 - 1    Nitrites (UA POC) Negative Negative    Leukocyte esterase (UA POC) 1+ Negative       Radiology: images reviewed today    Renal US 04/11/2016  Impression: ?? FINAL ??REPORT  Left mild pelviectasis, no hydroureter.  Right complex cyst with solid component, noted above.  Non-fully distended bladder. Bilateral urine jets.   Incidental finding of fatty liver to portions seen.        Romualdo Bolk MD  Assistant Professor, Dept of Urology  [facility]  [facility] [place], [place]  Pager #: [phone]    Patient's BMI is out of the normal parameters.  Information about BMI was  given to the patient.      Medical Documentation is provided with the assistance of Taj-ud-Din Leanna Sato, Medical Scribe for Jason Fila, MD on 04/13/2016.

## 2016-11-14 ENCOUNTER — Emergency Department (HOSPITAL_COMMUNITY): Payer: BC Managed Care – PPO

## 2016-11-14 ENCOUNTER — Encounter (HOSPITAL_COMMUNITY): Payer: Self-pay | Admitting: Nurse Practitioner

## 2016-11-14 ENCOUNTER — Emergency Department (HOSPITAL_COMMUNITY)
Admission: EM | Admit: 2016-11-14 | Discharge: 2016-11-14 | Disposition: A | Discharge status: Home / Self Care | Payer: BC Managed Care – PPO | Attending: Emergency Medicine | Admitting: Emergency Medicine

## 2016-11-14 DIAGNOSIS — Z7982 Long term (current) use of aspirin: Secondary | ICD-10-CM | POA: Diagnosis not present

## 2016-11-14 DIAGNOSIS — I10 Essential (primary) hypertension: Secondary | ICD-10-CM | POA: Insufficient documentation

## 2016-11-14 DIAGNOSIS — Z79899 Other long term (current) drug therapy: Secondary | ICD-10-CM | POA: Diagnosis not present

## 2016-11-14 DIAGNOSIS — Z5181 Encounter for therapeutic drug level monitoring: Secondary | ICD-10-CM | POA: Diagnosis not present

## 2016-11-14 DIAGNOSIS — G43109 Migraine with aura, not intractable, without status migrainosus: Secondary | ICD-10-CM | POA: Diagnosis not present

## 2016-11-14 DIAGNOSIS — R51 Headache: Secondary | ICD-10-CM | POA: Insufficient documentation

## 2016-11-14 DIAGNOSIS — M6281 Muscle weakness (generalized): Secondary | ICD-10-CM | POA: Diagnosis not present

## 2016-11-14 DIAGNOSIS — R531 Weakness: Secondary | ICD-10-CM

## 2016-11-14 HISTORY — DX: Pure hypercholesterolemia, unspecified: E78.00

## 2016-11-14 HISTORY — DX: Migraine, unspecified, not intractable, without status migrainosus: G43.909

## 2016-11-14 LAB — I-STAT CHEM 8, ED
BUN: 14 mg/dL (ref 6–20)
CALCIUM ION: 1.14 mmol/L — AB (ref 1.15–1.40)
CHLORIDE: 103 mmol/L (ref 101–111)
CREATININE: 0.8 mg/dL (ref 0.44–1.00)
GLUCOSE: 108 mg/dL — AB (ref 65–99)
HCT: 39 % (ref 36.0–46.0)
HEMOGLOBIN: 13.3 g/dL (ref 12.0–15.0)
Potassium: 3.7 mmol/L (ref 3.5–5.1)
Sodium: 140 mmol/L (ref 135–145)
TCO2: 24 mmol/L (ref 0–100)

## 2016-11-14 LAB — COMPREHENSIVE METABOLIC PANEL
ALK PHOS: 60 U/L (ref 38–126)
ALT: 8 U/L — AB (ref 14–54)
AST: 21 U/L (ref 15–41)
Albumin: 4.3 g/dL (ref 3.5–5.0)
Anion gap: 10 (ref 5–15)
BUN: 13 mg/dL (ref 6–20)
CALCIUM: 9.7 mg/dL (ref 8.9–10.3)
CO2: 24 mmol/L (ref 22–32)
CREATININE: 0.82 mg/dL (ref 0.44–1.00)
Chloride: 105 mmol/L (ref 101–111)
Glucose, Bld: 110 mg/dL — ABNORMAL HIGH (ref 65–99)
Potassium: 3.9 mmol/L (ref 3.5–5.1)
Sodium: 139 mmol/L (ref 135–145)
Total Bilirubin: 0.4 mg/dL (ref 0.3–1.2)
Total Protein: 7.2 g/dL (ref 6.5–8.1)

## 2016-11-14 LAB — CBC
HCT: 37.6 % (ref 36.0–46.0)
HEMOGLOBIN: 13.2 g/dL (ref 12.0–15.0)
MCH: 31.4 pg (ref 26.0–34.0)
MCHC: 35.1 g/dL (ref 30.0–36.0)
MCV: 89.3 fL (ref 78.0–100.0)
Platelets: 288 10*3/uL (ref 150–400)
RBC: 4.21 MIL/uL (ref 3.87–5.11)
RDW: 13.6 % (ref 11.5–15.5)
WBC: 5.6 10*3/uL (ref 4.0–10.5)

## 2016-11-14 LAB — DIFFERENTIAL
Basophils Absolute: 0 10*3/uL (ref 0.0–0.1)
Basophils Relative: 1 %
Eosinophils Absolute: 0.2 10*3/uL (ref 0.0–0.7)
Eosinophils Relative: 3 %
LYMPHS ABS: 3 10*3/uL (ref 0.7–4.0)
LYMPHS PCT: 53 %
MONO ABS: 0.5 10*3/uL (ref 0.1–1.0)
MONOS PCT: 9 %
NEUTROS ABS: 1.9 10*3/uL (ref 1.7–7.7)
Neutrophils Relative %: 34 %

## 2016-11-14 LAB — I-STAT TROPONIN, ED: TROPONIN I, POC: 0 ng/mL (ref 0.00–0.08)

## 2016-11-14 LAB — PROTIME-INR
INR: 0.97
Prothrombin Time: 12.9 seconds (ref 11.4–15.2)

## 2016-11-14 LAB — APTT: aPTT: 29 seconds (ref 24–36)

## 2016-11-14 MED ORDER — LORAZEPAM 2 MG/ML IJ SOLN: 1.0000 mg | Freq: Once | INTRAMUSCULAR | Status: DC

## 2016-11-14 MED ORDER — METOCLOPRAMIDE HCL 5 MG/ML IJ SOLN
10.0000 mg | Freq: Once | INTRAMUSCULAR | Status: AC
  Administered 2016-11-14: 10 mg via INTRAVENOUS
  Filled 2016-11-14: qty 2

## 2016-11-14 MED ORDER — LORAZEPAM 2 MG/ML IJ SOLN
INTRAMUSCULAR | Status: AC
  Filled 2016-11-14: qty 1

## 2016-11-14 MED ORDER — KETOROLAC TROMETHAMINE 15 MG/ML IJ SOLN
15.0000 mg | Freq: Once | INTRAMUSCULAR | Status: AC
  Administered 2016-11-14: 15 mg via INTRAVENOUS
  Filled 2016-11-14: qty 1

## 2016-11-14 MED ORDER — DIPHENHYDRAMINE HCL 50 MG/ML IJ SOLN
12.5000 mg | Freq: Once | INTRAMUSCULAR | Status: AC
  Administered 2016-11-14: 12.5 mg via INTRAVENOUS
  Filled 2016-11-14: qty 1

## 2016-11-14 MED ORDER — SODIUM CHLORIDE 0.9 % IV BOLUS (SEPSIS)
1000.0000 mL | Freq: Once | INTRAVENOUS | Status: AC
  Administered 2016-11-14: 1000 mL via INTRAVENOUS

## 2016-11-14 MED ORDER — LORAZEPAM 1 MG PO TABS
1.0000 mg | ORAL_TABLET | Freq: Once | ORAL | Status: AC
  Administered 2016-11-14: 1 mg via ORAL
  Filled 2016-11-14: qty 1

## 2016-11-14 NOTE — ED Triage Notes (Addendum)
Pt presents with c/o weakness. The weakness began about 1130 today. She feels weak on the R side of her face and in her R arm.  Her speech is normal. No facial droop or arm drift. She has a weaker grip on R side. She has had sharp headaches for 3 days now. She has a history of migraines. She has been taking tylenol with no relief.

## 2016-11-14 NOTE — Code Documentation (Signed)
60yo female presenting to the ED with c/o headache and right facial numbness and right arm weakness that started at 1130 today.  Patient has had headache x 3 days with h/o migraine.  Code stroke called in the ED.  Stroke team to the bedside.  CT already completed.  NIHSS 2 per ED RN - patient with decreased right arm sensation and mild right facial droop.  Facial droop not appreciated on exam, no right arm drift, however, patient reports headache and right arm feels weak.  Dr. Lucia GaskinsAhern at the bedside.  No acute stroke treatment at this time.  Bedside handoff with ED RN Hope.

## 2016-11-14 NOTE — ED Provider Notes (Signed)
[facility]-EMERGENCY DEPT Provider Note   CSN: [id number] Arrival date & time: 11/14/16  1448     History   Chief Complaint Chief Complaint  Patient presents with  . Code Stroke    HPI Monica Phillips is a 60 y.o. female.  HPI Patient is a 60 year old female with past medical history of hypertension, hypercholesterolemia, previous migraines who presents via EMS as a code stroke. Patient reports that at approximately 11:30 AM she began to have some right upper extremity weakness. She also felt numbness and tingling on the right side of her face and in her right arm. Patient reports she's had a mild bifrontal headache for the past 2 days. She rates as a 3 out of 10. No difficulty walking or weakness in her lower extremities. Denies recent fever, chills, cough, vomiting, diarrhea or other infectious symptoms. No recent head trauma or falls.She reports similar symptoms previously several years ago, was evaluated by neurology and was diagnosed with migraines at that time.   Past Medical History:  Diagnosis Date  . Hypercholesteremia   . Hypertension   . Migraine    with right sided weakness    Patient Active Problem List   Diagnosis Date Noted  . Complicated migraine 04/17/2012  . Dysphagia 04/03/2012  . Right sided weakness 03/07/2012  . Left knee pain 12/29/2011  . Osteoarthritis of left knee 12/14/2011  . Leg swelling 12/14/2011  . Knee effusion, left 12/02/2011  . HTN (hypertension) 12/02/2011  . PARESTHESIA 11/25/2010  . WEIGHT GAIN 11/05/2010  . HYPERLIPIDEMIA 07/06/2010  . BACK PAIN, LUMBAR 07/06/2010  . HYPERTENSION 12/04/2009    Past Surgical History:  Procedure Laterality Date  . LAPAROSCOPIC GASTRIC BANDING  05/2009  . UTERINE FIBROID SURGERY  1995    OB History    No data available       Home Medications    Prior to Admission medications   Medication Sig Start Date End Date Taking? Authorizing Provider  amLODipine (NORVASC) 10 MG tablet Take 10 mg by  mouth daily. 11/02/16  Yes Historical Provider, MD  aspirin EC 81 MG tablet Take 81 mg by mouth every morning.   Yes Historical Provider, MD  fish oil-omega-3 fatty acids 1000 MG capsule Take 1 g by mouth daily.     Yes Historical Provider, MD  metoprolol tartrate (LOPRESSOR) 25 MG tablet Take 25 mg by mouth 2 (two) times daily. 11/02/16  Yes Historical Provider, MD  rosuvastatin (CRESTOR) 20 MG tablet Take 20 mg by mouth daily. 07/13/16  Yes Historical Provider, MD  amitriptyline (ELAVIL) 25 MG tablet Take one half (1/2) tablet at HS for 2 weeks then one tab at HS thereafter. Patient not taking: Reported on 11/14/2016 04/06/12   Milas GainMatthew H Wong, MD  esomeprazole (NEXIUM) 40 MG capsule Take 1 capsule (40 mg total) by mouth daily. Patient not taking: Reported on 11/14/2016 04/03/12 11/14/16  Sandford CrazeMelissa O'Sullivan, NP  metoprolol (LOPRESSOR) 50 MG tablet Take 1 tablet (50 mg total) by mouth 2 (two) times daily. Patient not taking: Reported on 11/14/2016 04/18/12   Sandford CrazeMelissa O'Sullivan, NP  triamterene-hydrochlorothiazide (MAXZIDE-25) 37.5-25 MG per tablet Take 1 each (1 tablet total) by mouth daily. Patient not taking: Reported on 11/14/2016 04/18/12   Sandford CrazeMelissa O'Sullivan, NP    Family History Family History  Problem Relation Age of Onset  . Diabetes Mother     TYPE ii  . Hypertension Mother   . Stroke Mother   . Hyperlipidemia Mother   . Heart attack Father   .  Kidney disease Father   . Hypertension Father   . Heart disease Father     CABG  . Hyperlipidemia Father   . Diabetes Sister   . Hypertension Sister   . Diabetes Brother   . Hypertension Brother   . Cancer Brother     lymphoma  . Diabetes Sister   . Hypertension Sister     Social History Social History  Substance Use Topics  . Smoking status: Never Smoker  . Smokeless tobacco: Never Used  . Alcohol use No     Allergies   Patient has no known allergies.   Review of Systems Review of Systems  Constitutional: Negative for  chills and fever.  HENT: Negative for ear pain and sore throat.   Eyes: Negative for pain and visual disturbance.  Respiratory: Negative for cough and shortness of breath.   Cardiovascular: Negative for chest pain and palpitations.  Gastrointestinal: Negative for abdominal pain and vomiting.  Genitourinary: Negative for dysuria and hematuria.  Musculoskeletal: Negative for arthralgias and back pain.  Skin: Negative for color change and rash.  Neurological: Positive for weakness, numbness and headaches. Negative for seizures, syncope and speech difficulty.  All other systems reviewed and are negative.    Physical Exam Updated Vital Signs BP 122/69 (BP Location: Left Arm)   Pulse 98   Resp 16   Wt 96.2 kg   LMP 12/26/2009   SpO2 98%   BMI 36.97 kg/m   Physical Exam  Constitutional: She is oriented to person, place, and time. She appears well-developed and well-nourished. No distress.  HENT:  Head: Normocephalic and atraumatic.  Eyes: Conjunctivae and EOM are normal. Pupils are equal, round, and reactive to light.  Neck: Neck supple.  Cardiovascular: Normal rate and regular rhythm.   No murmur heard. Pulmonary/Chest: Effort normal and breath sounds normal. No respiratory distress.  Abdominal: Soft. There is no tenderness.  Musculoskeletal: She exhibits no edema.  Neurological: She is alert and oriented to person, place, and time. She displays normal reflexes. She exhibits normal muscle tone. Coordination normal.  4+/5 weakness in RUE. Decreased sensation to light touch over right lower face. Query very mild right facial droop. LUE, bilateral LE with 5/5 strength, normal sensation.   Skin: Skin is warm and dry. Capillary refill takes less than 2 seconds.  Psychiatric: She has a normal mood and affect.  Nursing note and vitals reviewed.    ED Treatments / Results  Labs (all labs ordered are listed, but only abnormal results are displayed) Labs Reviewed  COMPREHENSIVE  METABOLIC PANEL - Abnormal; Notable for the following:       Result Value   Glucose, Bld 110 (*)    ALT 8 (*)    All other components within normal limits  I-STAT CHEM 8, ED - Abnormal; Notable for the following:    Glucose, Bld 108 (*)    Calcium, Ion 1.14 (*)    All other components within normal limits  PROTIME-INR  APTT  CBC  DIFFERENTIAL  I-STAT TROPOININ, ED  CBG MONITORING, ED    EKG  EKG Interpretation  Date/Time:  Monday November 14 2016 15:26:50 EST Ventricular Rate:  65 PR Interval:    QRS Duration: 137 QT Interval:  432 QTC Calculation: 450 R Axis:   75 Text Interpretation:  Sinus rhythm Probable left atrial enlargement Right bundle branch block , new since last tracing Confirmed by KNAPP  MD-J, JON ([id number]) on 11/14/2016 3:32:12 PM       Radiology  Ct Head Code Stroke W/o Cm  Result Date: 11/14/2016 CLINICAL DATA:  Code stroke. 60 year old female with left facial weakness onset 1130 hours. Initial encounter. EXAM: CT HEAD WITHOUT CONTRAST TECHNIQUE: Contiguous axial images were obtained from the base of the skull through the vertex without intravenous contrast. COMPARISON:  Brain MRI 03/10/2012. Head CT without contrast 03/05/2012. FINDINGS: Brain: Cerebral volume is stable and within normal limits for age. No midline shift, ventriculomegaly, mass effect, evidence of mass lesion, intracranial hemorrhage or evidence of cortically based acute infarction. Gray-white matter differentiation is within normal limits throughout the brain. Vascular: Calcified atherosclerosis at the skull base. No suspicious intracranial vascular hyperdensity. Dominant distal left vertebral artery. Skull: No acute osseous abnormality identified. Sinuses/Orbits: Visualized paranasal sinuses and mastoids are stable and well pneumatized. Other: Negative orbit and scalp soft tissues. ASPECTS (Alberta Stroke Program Early CT Score) - Ganglionic level infarction (caudate, lentiform nuclei, internal  capsule, insula, M1-M3 cortex): 7 - Supraganglionic infarction (M4-M6 cortex): 3 Total score (0-10 with 10 being normal): 10 IMPRESSION: 1. Stable and normal noncontrast CT appearance of the brain. 2. ASPECTS is 10. 3. The above was relayed via text pager to Dr. Mervyn Skeeters. Ahern on 11/14/2016 at 1530 hours. Electronically Signed   By: Odessa Fleming M.D.   On: 11/14/2016 15:33    Procedures Procedures (including critical care time)  Medications Ordered in ED Medications  metoCLOPramide (REGLAN) injection 10 mg (10 mg Intravenous Given 11/14/16 1618)  diphenhydrAMINE (BENADRYL) injection 12.5 mg (12.5 mg Intravenous Given 11/14/16 1617)  sodium chloride 0.9 % bolus 1,000 mL (0 mLs Intravenous Stopped 11/14/16 1710)  LORazepam (ATIVAN) tablet 1 mg (1 mg Oral Given 11/14/16 2004)  ketorolac (TORADOL) 15 MG/ML injection 15 mg (15 mg Intravenous Given 11/14/16 1825)     Initial Impression / Assessment and Plan / ED Course  I have reviewed the triage vital signs and the nursing notes.  Pertinent labs & imaging results that were available during my care of the patient were reviewed by me and considered in my medical decision making (see chart for details).  Clinical Course     Patient is a 60 year old female past medical history as above who presents as a code stroke for right-sided weakness and right facial numbness. Presentation patient is alert and oriented 3. Exam as above with mild right upper extremity weakness, query right facial droop and subjective decreased sensation over right lower face. Patient was evaluated by Dr. Lucia Gaskins of neurology and CT head obtained. CT head without contrast negative for acute stroke. Patient's HA was treated with IV fluids, Reglan, Benadryl, and Toradol with relief. Plan was to obtain MRI of the brain without contrast. Unfortunately patient unable to tolerate MRI despite anxiolysis. She reports her symptoms have improved and she does not wish to undergo further imaging at this  time. Patient has had multiple negative MRIs in the past. She is tolerating po and ambulating without difficulty. She understands the risks/benefits of MRI. She was discharged home in stable condition. She was referred to outpatient neurology for further outpatient evaluation. Strict return precautions given. Patient in agreement with plan.  Discussed with Dr. Lynelle Doctor, ED attending  Final Clinical Impressions(s) / ED Diagnoses   Final diagnoses:  Right sided weakness    New Prescriptions Discharge Medication List as of 11/14/2016  9:48 PM       Isa Rankin, MD 11/15/16 [phone]

## 2016-11-14 NOTE — ED Notes (Signed)
Dr Lynelle DoctorKnapp in to talk with patient

## 2016-11-14 NOTE — ED Notes (Signed)
Went to MRI and attempted to talk patient into having the procedure completed.  Patient did not want to have it done and stated she would follow up with her PCP and get it scheduled by them.  Dr Lynelle DoctorKnapp notified

## 2016-11-14 NOTE — Consult Note (Signed)
Requesting Physician: Dr. Lynelle Doctor    Chief Complaint: Headache with right-sided weakness  History obtained from:  Patient    HPI:                                                                                                                                         Monica Phillips is an 60 y.o. female who has been seen by Dr. Modesto Charon is a second opinion from the [facility] neurology due to her headaches with right-sided weakness. She was seen in 2013 by Dr. Modesto Charon. At that time she had multiple MRIs of the brain which all had been unremarkable. At that time Dr. Modesto Charon suspected functional right-sided weakness with migraine headaches. Patient came in today to the ED at [facility] secondary to having a headache for 2 straight days but today at 11:30 she felt that she might have some right-sided weakness and twitching in her right upper lip. Upon arrival patient was called as a code stroke. CT of the head was negative. On exam patient had functional right-sided weakness with give way weakness. Patient states that she has a headache but it is now only a 3 out of 10. Code stroke was canceled. She has a history of headache with right-sided weakness (see [facility] 2013 notes)  Date last known well: Date: 11/14/2016 Time last known well: Time: 11:30 tPA Given: No: Minimal symptoms   Past Medical History:  Diagnosis Date  . Hypercholesteremia   . Hypertension   . Migraine    with right sided weakness    Past Surgical History:  Procedure Laterality Date  . LAPAROSCOPIC GASTRIC BANDING  05/2009  . UTERINE FIBROID SURGERY  1995    Family History  Problem Relation Age of Onset  . Diabetes Mother     TYPE ii  . Hypertension Mother   . Stroke Mother   . Hyperlipidemia Mother   . Heart attack Father   . Kidney disease Father   . Hypertension Father   . Heart disease Father     CABG  . Hyperlipidemia Father   . Diabetes Sister   . Hypertension Sister   . Diabetes Brother   . Hypertension  Brother   . Cancer Brother     lymphoma  . Diabetes Sister   . Hypertension Sister    Social History:  reports that she has never smoked. She has never used smokeless tobacco. She reports that she does not drink alcohol or use drugs.  Allergies: No Known Allergies  Medications:  No current facility-administered medications for this encounter.    Current Outpatient Prescriptions  Medication Sig Dispense Refill  . metoprolol (LOPRESSOR) 50 MG tablet Take 1 tablet (50 mg total) by mouth 2 (two) times daily. 60 tablet 2  . amitriptyline (ELAVIL) 25 MG tablet Take one half (1/2) tablet at HS for 2 weeks then one tab at HS thereafter. (Patient not taking: Reported on 11/14/2016) 30 tablet 2  . aspirin 81 MG EC tablet Take 81 mg by mouth daily.      Marland Kitchen. aspirin EC 81 MG tablet Take 81 mg by mouth every morning.    Marland Kitchen. esomeprazole (NEXIUM) 40 MG capsule Take 1 capsule (40 mg total) by mouth daily. 28 capsule 0  . fish oil-omega-3 fatty acids 1000 MG capsule Take 1 g by mouth daily.      Marland Kitchen. triamterene-hydrochlorothiazide (MAXZIDE-25) 37.5-25 MG per tablet Take 1 each (1 tablet total) by mouth daily. (Patient not taking: Reported on 11/14/2016) 30 tablet 2     ROS:                                                                                                                                       History obtained from the patient  General ROS: negative for - chills, fatigue, fever, night sweats, weight gain or weight loss Psychological ROS: negative for - behavioral disorder, hallucinations, memory difficulties, mood swings or suicidal ideation Ophthalmic ROS: negative for - blurry vision, double vision, eye pain or loss of vision ENT ROS: negative for - epistaxis, nasal discharge, oral lesions, sore throat, tinnitus or vertigo Allergy and Immunology ROS: negative for - hives  or itchy/watery eyes Hematological and Lymphatic ROS: negative for - bleeding problems, bruising or swollen lymph nodes Endocrine ROS: negative for - galactorrhea, hair pattern changes, polydipsia/polyuria or temperature intolerance Respiratory ROS: negative for - cough, hemoptysis, shortness of breath or wheezing Cardiovascular ROS: negative for - chest pain, dyspnea on exertion, edema or irregular heartbeat Gastrointestinal ROS: negative for - abdominal pain, diarrhea, hematemesis, nausea/vomiting or stool incontinence Genito-Urinary ROS: negative for - dysuria, hematuria, incontinence or urinary frequency/urgency Musculoskeletal ROS: negative for - joint swelling or muscular weakness Neurological ROS: as noted in HPI Dermatological ROS: negative for rash and skin lesion changes  Neurologic Examination:                                                                                                      Blood pressure 144/72, pulse 64, resp. rate 16,  weight 96.2 kg (212 lb), last menstrual period 12/26/2009, SpO2 100 %.  HEENT-  Normocephalic, no lesions, without obvious abnormality.  Normal external eye and conjunctiva.  Normal TM's bilaterally.  Normal auditory canals and external ears. Normal external nose, mucus membranes and septum.  Normal pharynx. Cardiovascular- S1, S2 normal, pulses palpable throughout   Lungs- chest clear, no wheezing, rales, normal symmetric air entry Abdomen- normal findings: bowel sounds normal Extremities- no edema Lymph-no adenopathy palpable Musculoskeletal-no joint tenderness, deformity or swelling Skin-warm and dry, no hyperpigmentation, vitiligo, or suspicious lesions  Neurological Examination Mental Status: Alert, oriented, thought content appropriate.  Speech fluent without evidence of aphasia.  Able to follow 3 step commands without difficulty. Cranial Nerves: II: Discs flat bilaterally; Visual fields grossly normal,  III,IV, VI: ptosis not present,  extra-ocular motions intact bilaterally, pupils equal, round, reactive to light and accommodation V,VII: smile symmetric, facial light touch sensation normal bilaterally VIII: hearing normal bilaterally IX,X: uvula rises symmetrically XI: bilateral shoulder shrug XII: midline tongue extension Motor: Right : Upper extremity   5/5    Left:     Upper extremity   5/5  Lower extremity   5/5     Lower extremity   5/5 Patient's right arm and right leg showed 5 out of 5 strength when she was distracted. When actually testing her arm patient showed significant give way weakness and poor effort. Tone and bulk:normal tone throughout; no atrophy noted Sensory: Pinprick and light touch intact throughout, bilaterally Deep Tendon Reflexes: 2+ and symmetric throughout Plantars: Right: downgoing   Left: downgoing Cerebellar: normal finger-to-nose, and normal heel-to-shin test Gait: Not tested       Lab Results: Basic Metabolic Panel:  Recent Labs Lab 11/14/16 1521  NA 140  K 3.7  CL 103  GLUCOSE 108*  BUN 14  CREATININE 0.80    Liver Function Tests: No results for input(s): AST, ALT, ALKPHOS, BILITOT, PROT, ALBUMIN in the last 168 hours. No results for input(s): LIPASE, AMYLASE in the last 168 hours. No results for input(s): AMMONIA in the last 168 hours.  CBC:  Recent Labs Lab 11/14/16 1510 11/14/16 1521  WBC 5.6  --   NEUTROABS 1.9  --   HGB 13.2 13.3  HCT 37.6 39.0  MCV 89.3  --   PLT 288  --     Cardiac Enzymes: No results for input(s): CKTOTAL, CKMB, CKMBINDEX, TROPONINI in the last 168 hours.  Lipid Panel: No results for input(s): CHOL, TRIG, HDL, CHOLHDL, VLDL, LDLCALC in the last 168 hours.  CBG: No results for input(s): GLUCAP in the last 168 hours.  Microbiology: Results for orders placed or performed during the hospital encounter of 05/18/11  MRSA PCR Screening     Status: None   Collection Time: 05/18/11  9:34 PM  Result Value Ref Range Status   MRSA  by PCR  NEGATIVE Final    NEGATIVE        The GeneXpert MRSA Assay (FDA approved for NASAL specimens only), is one component of a comprehensive MRSA colonization surveillance program. It is not intended to diagnose MRSA infection nor to guide or monitor treatment for MRSA infections.    Coagulation Studies:  Recent Labs  11/14/16 1510  LABPROT 12.9  INR 0.97    Imaging: Ct Head Code Stroke W/o Cm  Result Date: 11/14/2016 CLINICAL DATA:  Code stroke. 60 year old female with left facial weakness onset 1130 hours. Initial encounter. EXAM: CT HEAD WITHOUT CONTRAST TECHNIQUE: Contiguous axial images were obtained  from the base of the skull through the vertex without intravenous contrast. COMPARISON:  Brain MRI 03/10/2012. Head CT without contrast 03/05/2012. FINDINGS: Brain: Cerebral volume is stable and within normal limits for age. No midline shift, ventriculomegaly, mass effect, evidence of mass lesion, intracranial hemorrhage or evidence of cortically based acute infarction. Gray-white matter differentiation is within normal limits throughout the brain. Vascular: Calcified atherosclerosis at the skull base. No suspicious intracranial vascular hyperdensity. Dominant distal left vertebral artery. Skull: No acute osseous abnormality identified. Sinuses/Orbits: Visualized paranasal sinuses and mastoids are stable and well pneumatized. Other: Negative orbit and scalp soft tissues. ASPECTS (Alberta Stroke Program Early CT Score) - Ganglionic level infarction (caudate, lentiform nuclei, internal capsule, insula, M1-M3 cortex): 7 - Supraganglionic infarction (M4-M6 cortex): 3 Total score (0-10 with 10 being normal): 10 IMPRESSION: 1. Stable and normal noncontrast CT appearance of the brain. 2. ASPECTS is 10. 3. The above was relayed via text pager to Dr. Mervyn Skeeters. Ahern on 11/14/2016 at 1530 hours. Electronically Signed   By: Odessa Fleming M.D.   On: 11/14/2016 15:33       Assessment and plan discussed  with with attending physician and they are in agreement.    Felicie Morn PA-C [facility] [phone]  11/14/2016, 3:49 PM   Assessment: 60 y.o. female with known migraine headaches who has been worked up in the past for headaches with right-sided weakness(see MRI 02/2012 for similar presentation and Dr. Modesto Charon notes 03/2012). She was diagnosed with complicated migraines in 03/2012 with right-sided weakness and multiple emergency room visits for complicated migraine, multiple MRIs negative. CT of head was obtained in the ED and showed no acute abnormalities. Patient is complaining of a headache with the right sided deficits, c/w previous presentations in the past despite patient denying she ever had right-sided weakness with headaches.   Stroke Risk Factors - hypertension  Recommend: At this time would recommend obtaining MRI of brain without contrast. If this is negative no further neurological workup needs to be obtained. Treat the headache symptomatically. No further recommendations at this time. If MRI negative will sign off.    Personally examined patient and images, and have participated in and made any corrections needed to history, physical, neuro exam,assessment and plan as stated above.  I have personally obtained the history, evaluated lab date, reviewed imaging studies and agree with radiology interpretations.    Naomie Dean, MD [facility] [phone]

## 2016-11-14 NOTE — ED Notes (Signed)
Pt. Requested a regular diet , called nutrition and ordered regular tray.

## 2016-11-14 NOTE — Discharge Instructions (Addendum)
You were unable to get an MRI today due to anxiety regarding the scan, but your symptoms are resolving. Your head CT was normal with low suspicion for acute stroke. You should follow up with neurology for re-evaluation of your symptoms.   If you experience worsening of your symptoms including difficulty walking, changes in vision, worsening weakness or numbness you should return to the emergency department for evaluation.

## 2016-11-14 NOTE — ED Notes (Signed)
Called MRI and they stated the scanner is being worked on and they will call when they are coming for her

## 2016-11-14 NOTE — ED Notes (Signed)
Called MRI to check on wait time for pt. MRI will come for pt in approx 45 mins. Notified pt

## 2016-11-14 NOTE — ED Notes (Signed)
MRI called and patient is still anxious unable to complete mprocedure.  Dr Lynelle DoctorKnapp aware

## 2016-11-14 NOTE — ED Provider Notes (Signed)
MSE was initiated and I personally evaluated the patient and placed orders (if any) at  3:17 PM on November 14, 2016.  The patient appears stable so that the remainder of the MSE may be completed by another provider.  I was asked to see the patient in triage, there was a complaint of a headache that started earlier this morning, at approximately 11:30 she developed some weakness and numbness of her right arm as well as some numbness of the right face. She has been having intermittent sharp headaches as well. On exam the patient does have a slight weakness of the right arm, there is a possibility that there is some slight pronator drift on the right, she has no facial asymmetry but does have a decreased sensation to light touch of the right face and the right arm. Normal strength in the lower extremities, normal finger nose finger, code stroke was activated and the patient will be moved to an acute care room for further evaluation.   Eber HongBrian Domonique Cothran, MD 11/14/16 [phone]

## 2016-11-14 NOTE — ED Notes (Signed)
Pt. Eating Mal[place]key sandwich with ginger ale. Stated, my pain is better.

## 2016-11-14 NOTE — ED Provider Notes (Signed)
Pt is a 60 y.o. female who presents with  Chief Complaint  Patient presents with  . Code Stroke  Pt presented to the ED with complaints of headache associated with weakness and numbness on the right side.  She had similar episodes in the past and was evaluated for a possible stroke. She ultimately was diagnosed with a migraine.  Physical Exam  Constitutional: No distress.  HENT:  Head: Normocephalic and atraumatic.  Eyes: Conjunctivae are normal. Left eye exhibits no discharge. No scleral icterus.  Neck: No tracheal deviation present. No thyromegaly present.  Pulmonary/Chest: Effort normal and breath sounds normal. No stridor.  Abdominal: She exhibits no distension.  Musculoskeletal: She exhibits no tenderness or deformity.  Neurological: She is alert.  Altered sensation in the right upper extremity, ?mild right facial droop  Skin: Skin is warm. No rash noted. She is not diaphoretic. No erythema.  Psychiatric: Affect normal.    Clinical Course      EKG Interpretation  Date/Time:  Monday November 14 2016 15:26:50 EST Ventricular Rate:  65 PR Interval:    QRS Duration: 137 QT Interval:  432 QTC Calculation: 450 R Axis:   75 Text Interpretation:  Sinus rhythm Probable left atrial enlargement Right bundle branch block , new since last tracing Confirmed by Delena Casebeer  MD-J, Atlas Kuc ([id number]) on 11/14/2016 3:32:12 PM      Code stroke was activated in the ED.   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  Neurology consult.   Neurology evaluated the patient.  Does not recommend TPA.  Recommends MRI. If  negative, ok for dc.  We attempted to do an MRI with ativan. Pt became too anxious.  She refused another attempt with another dose of ativan.  Overall low suspicion for stroke but discussed with patient we cant exclude stroke.  She understands.  Plan on outpatient neurology referral.  1. Right sided weakness    I saw  and evaluated the patient, reviewed the resident's note and I agree with the findings and plan.    Linwood DibblesJon Curley Fayette, MD 11/15/16 [phone]

## 2016-12-21 ENCOUNTER — Ambulatory Visit: Payer: BC Managed Care – PPO | Admitting: Neurology

## 2016-12-22 ENCOUNTER — Encounter: Payer: Self-pay | Admitting: Neurology

## 2017-01-23 ENCOUNTER — Ambulatory Visit: Payer: BC Managed Care – PPO | Admitting: Neurology

## 2017-02-07 ENCOUNTER — Other Ambulatory Visit: Payer: Self-pay | Admitting: Nurse Practitioner

## 2017-02-07 DIAGNOSIS — Z1231 Encounter for screening mammogram for malignant neoplasm of breast: Secondary | ICD-10-CM

## 2017-03-08 NOTE — Progress Notes (Signed)
Called pt home # someone states that she was not the pt called pt mobile # LM on pt VM with [facility] Christiane HaJonathan # and also my ext [phone] to schedule scan before her f/u appt with Dr Lalla BrothersLambert pm 04/12/17.

## 2017-03-08 NOTE — Progress Notes (Signed)
I have been attempting contact tot his patent since February 24, 2017 with no answer to schedule MRI Abdomen prior to her follow up appointment.

## 2017-03-08 NOTE — Progress Notes (Signed)
03-13-2017    The patient still has not answered the phone. I will no longer contact her, but she may call me at [phone].

## 2017-03-17 NOTE — Progress Notes (Signed)
PT is scheduled for MRI Abdomen on Saturday, April 01, 2017 to arrive at [facility] at 10:00am. PT is aware.    [facility].

## 2017-03-20 ENCOUNTER — Ambulatory Visit
Admission: RE | Admit: 2017-03-20 | Discharge: 2017-03-20 | Disposition: A | Discharge status: Home / Self Care | Payer: BC Managed Care – PPO | Source: Ambulatory Visit | Attending: Nurse Practitioner | Admitting: Nurse Practitioner

## 2017-03-20 DIAGNOSIS — Z1231 Encounter for screening mammogram for malignant neoplasm of breast: Secondary | ICD-10-CM

## 2017-03-31 NOTE — Telephone Encounter (Signed)
Pt called to reschedule her 1 year follow up appt with Dr. Lalla Brothers.  She stated that the imaging will cost her more than $700, and she's not able to afford to pay that amount.  I informed pt to contact her insurance company for advice to where she can get the MRI abdomen done.   Pt asked for suggestions. She can be reached at [phone].

## 2017-04-12 ENCOUNTER — Encounter: Attending: Urology | Primary: Family Medicine

## 2017-04-21 ENCOUNTER — Encounter: Attending: Urology | Primary: Family Medicine

## 2017-04-28 ENCOUNTER — Encounter (HOSPITAL_BASED_OUTPATIENT_CLINIC_OR_DEPARTMENT_OTHER): Payer: Self-pay | Admitting: *Deleted

## 2017-04-28 ENCOUNTER — Emergency Department (HOSPITAL_BASED_OUTPATIENT_CLINIC_OR_DEPARTMENT_OTHER)
Admission: EM | Admit: 2017-04-28 | Discharge: 2017-04-28 | Disposition: A | Discharge status: Home / Self Care | Payer: BC Managed Care – PPO | Attending: Emergency Medicine | Admitting: Emergency Medicine

## 2017-04-28 ENCOUNTER — Emergency Department (HOSPITAL_BASED_OUTPATIENT_CLINIC_OR_DEPARTMENT_OTHER): Payer: BC Managed Care – PPO

## 2017-04-28 DIAGNOSIS — Z7982 Long term (current) use of aspirin: Secondary | ICD-10-CM | POA: Diagnosis not present

## 2017-04-28 DIAGNOSIS — S8002XA Contusion of left knee, initial encounter: Secondary | ICD-10-CM

## 2017-04-28 DIAGNOSIS — Y939 Activity, unspecified: Secondary | ICD-10-CM | POA: Diagnosis not present

## 2017-04-28 DIAGNOSIS — I10 Essential (primary) hypertension: Secondary | ICD-10-CM | POA: Diagnosis not present

## 2017-04-28 DIAGNOSIS — Z79899 Other long term (current) drug therapy: Secondary | ICD-10-CM | POA: Insufficient documentation

## 2017-04-28 DIAGNOSIS — Y999 Unspecified external cause status: Secondary | ICD-10-CM | POA: Diagnosis not present

## 2017-04-28 DIAGNOSIS — Y929 Unspecified place or not applicable: Secondary | ICD-10-CM | POA: Diagnosis not present

## 2017-04-28 DIAGNOSIS — W228XXA Striking against or struck by other objects, initial encounter: Secondary | ICD-10-CM | POA: Diagnosis not present

## 2017-04-28 DIAGNOSIS — S8992XA Unspecified injury of left lower leg, initial encounter: Secondary | ICD-10-CM | POA: Diagnosis present

## 2017-04-28 MED ORDER — IBUPROFEN 600 MG PO TABS: 600.0000 mg | ORAL_TABLET | Freq: Three times a day (TID) | ORAL | 0 refills | Status: AC | PRN

## 2017-04-28 MED FILL — IBUPROFEN 600 MG TABLET: 600 | 10 days supply | Qty: 30 | Fill #0

## 2017-04-28 NOTE — ED Triage Notes (Signed)
She hit her left knee on a desk yesterday. [facility] but does not know the results.

## 2017-04-28 NOTE — ED Notes (Signed)
[facility] and had [facility]. Pt seen by PCP today and given tramadol and ortho referral if needed.

## 2017-04-28 NOTE — ED Notes (Signed)
Patient transported to X-ray 

## 2017-04-30 NOTE — ED Provider Notes (Signed)
[facility]-EMERGENCY DEPT Provider Note   CSN: [id number] Arrival date & time: 04/28/17  1217     History   Chief Complaint Chief Complaint  Patient presents with  . Knee Injury    HPI Monica Phillips is a 61 y.o. female.  HPI Patient states she struck her left knee on her desk yesterday when trying to get up from a seated position. She's had persistent pain to the anterior knee. She's had some pain with ambulation. She also is having intermittent tingling sensation down the leg. Denies any back pain. Denies any focal weakness. Denies incontinence. Past Medical History:  Diagnosis Date  . Hypercholesteremia   . Hypertension   . Migraine    with right sided weakness    Patient Active Problem List   Diagnosis Date Noted  . Complicated migraine 04/17/2012  . Dysphagia 04/03/2012  . Right sided weakness 03/07/2012  . Left knee pain 12/29/2011  . Osteoarthritis of left knee 12/14/2011  . Leg swelling 12/14/2011  . Knee effusion, left 12/02/2011  . HTN (hypertension) 12/02/2011  . PARESTHESIA 11/25/2010  . WEIGHT GAIN 11/05/2010  . HYPERLIPIDEMIA 07/06/2010  . BACK PAIN, LUMBAR 07/06/2010  . HYPERTENSION 12/04/2009    Past Surgical History:  Procedure Laterality Date  . LAPAROSCOPIC GASTRIC BANDING  05/2009  . UTERINE FIBROID SURGERY  1995    OB History    No data available       Home Medications    Prior to Admission medications   Medication Sig Start Date End Date Taking? Authorizing Provider  amLODipine (NORVASC) 10 MG tablet Take 10 mg by mouth daily. 11/02/16  Yes [provider]  aspirin EC 81 MG tablet Take 81 mg by mouth every morning.   Yes [provider]  fish oil-omega-3 fatty acids 1000 MG capsule Take 1 g by mouth daily.     Yes [provider]  metoprolol tartrate (LOPRESSOR) 25 MG tablet Take 25 mg by mouth 2 (two) times daily. 11/02/16  Yes [provider]  rosuvastatin (CRESTOR) 20 MG tablet Take 20 mg by mouth  daily. 07/13/16  Yes [provider]  amitriptyline (ELAVIL) 25 MG tablet Take one half (1/2) tablet at HS for 2 weeks then one tab at HS thereafter. Patient not taking: Reported on 11/14/2016 04/06/12   Milas Gain, MD  esomeprazole (NEXIUM) 40 MG capsule Take 1 capsule (40 mg total) by mouth daily. Patient not taking: Reported on 11/14/2016 04/03/12 11/14/16  Sandford Craze, NP  ibuprofen (ADVIL,MOTRIN) 600 MG tablet Take 1 tablet (600 mg total) by mouth 3 (three) times daily with meals as needed for moderate pain. 04/28/17   Loren Racer, MD  metoprolol (LOPRESSOR) 50 MG tablet Take 1 tablet (50 mg total) by mouth 2 (two) times daily. Patient not taking: Reported on 11/14/2016 04/18/12   Sandford Craze, NP  triamterene-hydrochlorothiazide (MAXZIDE-25) 37.5-25 MG per tablet Take 1 each (1 tablet total) by mouth daily. Patient not taking: Reported on 11/14/2016 04/18/12   Sandford Craze, NP    Family History Family History  Problem Relation Age of Onset  . Diabetes Mother     TYPE ii  . Hypertension Mother   . Stroke Mother   . Hyperlipidemia Mother   . Heart attack Father   . Kidney disease Father   . Hypertension Father   . Heart disease Father     CABG  . Hyperlipidemia Father   . Diabetes Sister   . Hypertension Sister   . Diabetes Brother   .  Hypertension Brother   . Cancer Brother     lymphoma  . Diabetes Sister   . Hypertension Sister     Social History Social History  Substance Use Topics  . Smoking status: Never Smoker  . Smokeless tobacco: Never Used  . Alcohol use No     Allergies   Patient has no known allergies.   Review of Systems Review of Systems  Constitutional: Negative for chills and fever.  Respiratory: Negative for shortness of breath.   Cardiovascular: Negative for chest pain, palpitations and leg swelling.  Gastrointestinal: Negative for abdominal pain, constipation, diarrhea, nausea and vomiting.  Musculoskeletal:  Positive for arthralgias. Negative for back pain, neck pain and neck stiffness.  Skin: Negative for rash.  Neurological: Positive for numbness. Negative for dizziness, syncope, weakness, light-headedness and headaches.  All other systems reviewed and are negative.    Physical Exam Updated Vital Signs BP 134/72 (BP Location: Right Arm)   Pulse 86   Temp 98.9 F (37.2 C) (Oral)   Resp 18   Ht 5\' 3"  (1.6 m)   Wt 195 lb (88.5 kg)   LMP 12/26/2009   SpO2 98%   BMI 34.54 kg/m   Physical Exam  Constitutional: She is oriented to person, place, and time. She appears well-developed and well-nourished. No distress.  HENT:  Head: Normocephalic and atraumatic.  Mouth/Throat: Oropharynx is clear and moist.  Eyes: EOM are normal. Pupils are equal, round, and reactive to light.  Neck: Normal range of motion. Neck supple.  No posterior midline cervical tenderness to palpation.  Cardiovascular: Normal rate and regular rhythm.   Pulmonary/Chest: Effort normal and breath sounds normal.  Abdominal: Soft. Bowel sounds are normal. There is no tenderness. There is no rebound and no guarding.  Musculoskeletal: Normal range of motion. She exhibits no edema or tenderness.  No midline thoracic or lumbar tenderness. Negative straight leg raise bilaterally. Patient has mild tenderness to palpation over the patella of the left knee. There is no obvious deformity. She has full range of motion of the left knee. No ligamentous instability. No effusion appreciated. No evidence of lower extremity edema or asymmetry. Distal pulses are 2+. Patient ambulates without difficulty.  Neurological: She is alert and oriented to person, place, and time.  5/5 motor in all extremities. Sensation fully intact. No saddle anesthesia.  Skin: Skin is warm and dry. No rash noted. No erythema.  Psychiatric: She has a normal mood and affect. Her behavior is normal.  Nursing note and vitals reviewed.    ED Treatments / Results    Labs (all labs ordered are listed, but only abnormal results are displayed) Labs Reviewed - No data to display  EKG  EKG Interpretation None       Radiology Dg Knee Complete 4 Views Left  Result Date: 04/28/2017 CLINICAL DATA:  Struck LEFT knee against a desk yesterday, medial pain radiating up leg, injury EXAM: LEFT KNEE - COMPLETE 4+ VIEW COMPARISON:  12/05/2011 FINDINGS: Mild osseous demineralization. Medial compartment joint space narrowing. No acute fracture, dislocation, or bone destruction. No knee joint effusion or significant regional soft tissue abnormality. IMPRESSION: Minimal degenerative changes without acute bony abnormalities. Electronically Signed   By: Ulyses Southward M.D.   On: 04/28/2017 12:53    Procedures Procedures (including critical care time)  Medications Ordered in ED Medications - No data to display   Initial Impression / Assessment and Plan / ED Course  I have reviewed the triage vital signs and the nursing notes.  Pertinent labs & imaging results that were available during my care of the patient were reviewed by me and considered in my medical decision making (see chart for details).     No x-ray evidence of fracture. Given exam and mechanism low suspicion for significant injury. We'll treat for contusion. Patient may have some degree of radiculopathy that is exacerbated by this recent incident. No concerning neurologic findings. Do not believe that emergent MRI is necessary at this point. Advised to follow-up with orthopedics or sports medicine if her symptoms persist. We'll treat with RICE therapy. Return precautions given.  Final Clinical Impressions(s) / ED Diagnoses   Final diagnoses:  Contusion of left knee, initial encounter    New Prescriptions Discharge Medication List as of 04/28/2017  1:09 PM    START taking these medications   Details  ibuprofen (ADVIL,MOTRIN) 600 MG tablet Take 1 tablet (600 mg total) by mouth 3 (three) times daily with  meals as needed for moderate pain., Starting Fri 04/28/2017, Print         Loren RacerYelverton, Maylea Soria, MD 04/30/17 1053

## 2017-05-04 ENCOUNTER — Ambulatory Visit: Payer: Self-pay | Admitting: Family Medicine

## 2017-05-09 ENCOUNTER — Ambulatory Visit: Payer: Self-pay | Admitting: Family Medicine

## 2017-05-18 NOTE — Telephone Encounter (Signed)
Patient call back asking for any information on what she can do regarding getting imaging some where else because her insurance stated she has to pay $700 and she's unable to afford to pay that. Patient appt was cancel for 05/19/17 due to no imaging being done. Patient would like you to call her at [phone]     Thank you!

## 2017-05-19 ENCOUNTER — Encounter: Attending: Urology | Primary: Family Medicine

## 2017-05-26 NOTE — Telephone Encounter (Signed)
Return call placed to discuss the option of having imaging done at [facility]. This may offer a less expensive option. Unavailable at this time. Message left with female to have pt return call.     Alleen BorneMelanie R Jasara Corrigan, LPN

## 2017-06-21 ENCOUNTER — Encounter

## 2017-06-22 ENCOUNTER — Inpatient Hospital Stay: Payer: BLUE CROSS/BLUE SHIELD | Attending: Surgery | Primary: Family Medicine

## 2021-04-23 ENCOUNTER — Inpatient Hospital Stay: Admit: 2021-04-23 | Discharge: 2021-04-23 | Payer: BLUE CROSS/BLUE SHIELD | Primary: Family Medicine

## 2021-04-23 LAB — CBC WITH AUTOMATED DIFF
BASOPHILS: 0.5 % (ref 0–3)
EOSINOPHILS: 1.6 % (ref 0–5)
HCT: 40.4 % (ref 37.0–50.0)
HGB: 13.9 gm/dl (ref 13.0–17.2)
IMMATURE GRANULOCYTES: 0.3 % (ref 0.0–3.0)
LYMPHOCYTES: 44.9 % (ref 28–48)
MCH: 32.4 pg (ref 25.4–34.6)
MCHC: 34.4 gm/dl (ref 30.0–36.0)
MCV: 94.2 fL (ref 80.0–98.0)
MONOCYTES: 8.1 % (ref 1–13)
MPV: 9.7 fL (ref 6.0–10.0)
NEUTROPHILS: 44.6 % (ref 34–64)
NRBC: 0 (ref 0–0)
PLATELET: 300 10*3/uL (ref 140–450)
RBC: 4.29 M/uL (ref 3.60–5.20)
RDW-SD: 44.4 (ref 36.4–46.3)
WBC: 6.3 10*3/uL (ref 4.0–11.0)

## 2021-04-23 LAB — PTT: aPTT: 35.6 seconds (ref 25.1–36.5)

## 2021-04-23 LAB — PROTHROMBIN TIME + INR
INR: 1.1 (ref 0.1–1.1)
Prothrombin time: 12.4 seconds (ref 10.2–12.9)

## 2021-04-23 LAB — CBC WITH AUTO DIFFERENTIAL
Basophils %: 0.5 % (ref 0–3)
Eosinophils %: 1.6 % (ref 0–5)
Hematocrit: 40.4 % (ref 37.0–50.0)
Hemoglobin: 13.9 gm/dl (ref 13.0–17.2)
Immature Granulocytes: 0.3 % (ref 0.0–3.0)
Lymphocytes %: 44.9 % (ref 28–48)
MCH: 32.4 pg (ref 25.4–34.6)
MCHC: 34.4 gm/dl (ref 30.0–36.0)
MCV: 94.2 fL (ref 80.0–98.0)
MPV: 9.7 fL (ref 6.0–10.0)
Monocytes %: 8.1 % (ref 1–13)
Neutrophils %: 44.6 % (ref 34–64)
Nucleated RBCs: 0 (ref 0–0)
Platelets: 300 10*3/uL (ref 140–450)
RBC: 4.29 M/uL (ref 3.60–5.20)
RDW-SD: 44.4 (ref 36.4–46.3)
WBC: 6.3 10*3/uL (ref 4.0–11.0)

## 2021-04-23 LAB — APTT: aPTT: 35.6 seconds (ref 25.1–36.5)

## 2021-04-23 LAB — PROTIME-INR
INR: 1.1 (ref 0.1–1.1)
Protime: 12.4 seconds (ref 10.2–12.9)

## 2021-04-24 LAB — METABOLIC PANEL, COMPREHENSIVE
ALT (SGPT): 17 U/L (ref 12–78)
AST (SGOT): 34 U/L (ref 15–37)
Albumin: 4.4 gm/dl (ref 3.4–5.0)
Alk. phosphatase: 71 U/L (ref 45–117)
Anion gap: 14 mmol/L (ref 5–15)
BUN: 12 mg/dl (ref 7–25)
Bilirubin, total: 0.6 mg/dl (ref 0.2–1.0)
CO2: 24 mEq/L (ref 21–32)
Calcium: 9.7 mg/dl (ref 8.5–10.1)
Chloride: 100 mEq/L (ref 98–107)
Creatinine: 0.8 mg/dl (ref 0.6–1.3)
GFR est AA: >60
GFR est non-AA: >60
Glucose: 92 mg/dl (ref 74–106)
Potassium: 3.9 mEq/L (ref 3.5–5.1)
Protein, total: 7.7 gm/dl (ref 6.4–8.2)
Sodium: 138 mEq/L (ref 136–145)

## 2021-04-24 LAB — LIPID PANEL
CHOL/HDL Ratio: 5.3 Ratio — ABNORMAL HIGH (ref 0.0–4.4)
Chol/HDL Ratio: 5.3 Ratio — ABNORMAL HIGH (ref 0.0–4.4)
Cholesterol, Total: 221 mg/dl — ABNORMAL HIGH (ref 140–199)
Cholesterol, total: 221 mg/dl — ABNORMAL HIGH (ref 140–199)
HDL Cholesterol: 42 mg/dl (ref 40–96)
HDL: 42 mg/dl (ref 40–96)
LDL Calculated: 145 mg/dl — ABNORMAL HIGH (ref 0–130)
LDL, calculated: 145 mg/dl — ABNORMAL HIGH (ref 0–130)
Triglyceride: 169 mg/dl — ABNORMAL HIGH (ref 29–150)
Triglycerides: 169 mg/dl — ABNORMAL HIGH (ref 29–150)

## 2021-04-24 LAB — H PYLORI AB, IGG, QT
H. PYLORI AB, IGG: 0.49 (ref 0.00–0.74)
H. pylori Ab, IgG: 0.49 (ref 0.00–0.74)

## 2021-04-24 LAB — T4, FREE
FREE T4, FT4T: 1.11 ng/dl (ref 0.76–1.46)
Free T4: 1.11 ng/dl (ref 0.76–1.46)

## 2021-04-24 LAB — FERRITIN
Ferritin: 154.1 ng/ml (ref 7.3–270.7)
Ferritin: 154.1 ng/ml (ref 7.3–270.7)

## 2021-04-24 LAB — HEMOGLOBIN A1C W/O EAG
Hemoglobin A1C: 6.2 % — ABNORMAL HIGH (ref 4.0–6.0)
Hemoglobin A1c: 6.2 % — ABNORMAL HIGH (ref 4.0–6.0)

## 2021-04-24 LAB — TSH 3RD GENERATION
TSH: 2.657 u[IU]/mL (ref 0.358–3.740)
TSH: 2.657 u[IU]/mL (ref 0.358–3.740)

## 2021-04-24 LAB — VITAMIN B12
VITAMIN B12: 283 pg/ml (ref 211–911)
Vitamin B12: 283 pg/ml (ref 211–911)

## 2021-04-24 LAB — COMPREHENSIVE METABOLIC PANEL
ALT: 17 U/L (ref 12–78)
AST: 34 U/L (ref 15–37)
Albumin: 4.4 gm/dl (ref 3.4–5.0)
Alkaline Phosphatase: 71 U/L (ref 45–117)
Anion Gap: 14 mmol/L (ref 5–15)
BUN: 12 mg/dl (ref 7–25)
CO2: 24 mEq/L (ref 21–32)
Calcium: 9.7 mg/dl (ref 8.5–10.1)
Chloride: 100 mEq/L (ref 98–107)
Creatinine: 0.8 mg/dl (ref 0.6–1.3)
EGFR IF NonAfrican American: >60
GFR African American: >60
Glucose: 92 mg/dl (ref 74–106)
Potassium: 3.9 mEq/L (ref 3.5–5.1)
Sodium: 138 mEq/L (ref 136–145)
Total Bilirubin: 0.6 mg/dl (ref 0.2–1.0)
Total Protein: 7.7 gm/dl (ref 6.4–8.2)

## 2021-04-27 LAB — VITAMIN B1, PLASMA
VITAMIN B1, PLASMA: 8 nmol/L (ref 8–30)
Vitamin B1, plasma: 8 nmol/L (ref 8–30)

## 2021-04-28 LAB — NICOTINE/COTININE, UR, QT
COTININE,URINE: <2 ng/mL
COTININE,URINE: <2 ng/mL
NICOTINE, URINE,NICU1: <2 ng/mL
NICOTINE,URINE: <2 ng/mL
